# Patient Record
Sex: Female | Born: 1940 | Race: White | Hispanic: No | Smoking: Never smoker
Health system: Southern US, Community
[De-identification: ages and names within clinical notes are randomized; demographics above are authoritative.]

## PROBLEM LIST (undated history)

## (undated) DIAGNOSIS — I00 Rheumatic fever without heart involvement: Secondary | ICD-10-CM

## (undated) DIAGNOSIS — M1990 Unspecified osteoarthritis, unspecified site: Secondary | ICD-10-CM

## (undated) DIAGNOSIS — K219 Gastro-esophageal reflux disease without esophagitis: Secondary | ICD-10-CM

## (undated) DIAGNOSIS — E039 Hypothyroidism, unspecified: Secondary | ICD-10-CM

## (undated) DIAGNOSIS — Z8719 Personal history of other diseases of the digestive system: Secondary | ICD-10-CM

## (undated) DIAGNOSIS — C439 Malignant melanoma of skin, unspecified: Secondary | ICD-10-CM

## (undated) HISTORY — PX: TONSILLECTOMY: SUR1361

## (undated) HISTORY — PX: COLONOSCOPY: SHX174

## (undated) HISTORY — PX: ABDOMINAL HYSTERECTOMY: SHX81

## (undated) HISTORY — PX: APPENDECTOMY: SHX54

## (undated) HISTORY — PX: CHOLECYSTECTOMY: SHX55

---

## 2017-12-01 ENCOUNTER — Other Ambulatory Visit: Payer: Self-pay | Admitting: Orthopedic Surgery

## 2017-12-02 NOTE — Patient Instructions (Addendum)
Aneesa Romey  12/02/2017   Your procedure is scheduled on: Friday 12/10/2017   Report to [facility] Main  Entrance              Report to admitting at  1145  AM    Call this number if you have problems the morning of surgery [phone]    Remember: Do not eat food :After Midnight.  May have clear liquids from midnight up until 0815 am then nothing until after surgery!    CLEAR LIQUID DIET   Foods Allowed                                                                     Foods Excluded  Coffee and tea, regular and decaf                             liquids that you cannot  Plain Jell-O in any flavor                                             see through such as: Fruit ices (not with fruit pulp)                                     milk, soups, orange juice  Iced Popsicles                                    All solid food Carbonated beverages, regular and diet                                    Cranberry, grape and apple juices Sports drinks like Gatorade Lightly seasoned clear broth or consume(fat free) Sugar, honey syrup  Sample Menu Breakfast                                Lunch                                     Supper Cranberry juice                    Beef broth                            Chicken broth Jell-O                                     Grape juice  Apple juice Coffee or tea                        Jell-O                                      Popsicle                                                Coffee or tea                        Coffee or tea  _____________________________________________________________________               BRUSH YOUR TEETH MORNING OF SURGERY AND RINSE YOUR MOUTH OUT, NO CHEWING GUM CANDY OR MINTS.     Take these medicines the morning of surgery with A SIP OF WATER: levothyroxine, doxycycline               You may not have any metal on your body including hair pins and      piercings  Do not wear jewelry, make-up, lotions, powders or perfumes, deodorant             Do not wear nail polish.  Do not shave  48 hours prior to surgery.                Do not bring valuables to the hospital. [place].  Contacts, dentures or bridgework may not be worn into surgery.  Leave suitcase in the car. After surgery it may be brought to your room.                  Please read over the following fact sheets you were given: _____________________________________________________________________             [facility] - Preparing for Surgery Before surgery, you can play an important role.  Because skin is not sterile, your skin needs to be as free of germs as possible.  You can reduce the number of germs on your skin by washing with CHG (chlorahexidine gluconate) soap before surgery.  CHG is an antiseptic cleaner which kills germs and bonds with the skin to continue killing germs even after washing. Please DO NOT use if you have an allergy to CHG or antibacterial soaps.  If your skin becomes reddened/irritated stop using the CHG and inform your nurse when you arrive at Short Stay. Do not shave (including legs and underarms) for at least 48 hours prior to the first CHG shower.  You may shave your face/neck. Please follow these instructions carefully:  1.  Shower with CHG Soap the night before surgery and the  morning of Surgery.  2.  If you choose to wash your hair, wash your hair first as usual with your  normal  shampoo.  3.  After you shampoo, rinse your hair and body thoroughly to remove the  shampoo.                           4.  Use CHG as you would any other  liquid soap.  You can apply chg directly  to the skin and wash                       Gently with a scrungie or clean washcloth.  5.  Apply the CHG Soap to your body ONLY FROM THE NECK DOWN.   Do not use on face/ open                           Wound or open sores. Avoid  contact with eyes, ears mouth and genitals (private parts).                       Wash face,  Genitals (private parts) with your normal soap.             6.  Wash thoroughly, paying special attention to the area where your surgery  will be performed.  7.  Thoroughly rinse your body with warm water from the neck down.  8.  DO NOT shower/wash with your normal soap after using and rinsing off  the CHG Soap.                9.  Pat yourself dry with a clean towel.            10.  Wear clean pajamas.            11.  Place clean sheets on your bed the night of your first shower and do not  sleep with pets. Day of Surgery : Do not apply any lotions/deodorants the morning of surgery.  Please wear clean clothes to the hospital/surgery center.  FAILURE TO FOLLOW THESE INSTRUCTIONS MAY RESULT IN THE CANCELLATION OF YOUR SURGERY PATIENT SIGNATURE_________________________________  NURSE SIGNATURE__________________________________  ________________________________________________________________________   Adam Phenix  An incentive spirometer is a tool that can help keep your lungs clear and active. This tool measures how well you are filling your lungs with each breath. Taking long deep breaths may help reverse or decrease the chance of developing breathing (pulmonary) problems (especially infection) following:  A long period of time when you are unable to move or be active. BEFORE THE PROCEDURE   If the spirometer includes an indicator to show your best effort, your nurse or respiratory therapist will set it to a desired goal.  If possible, sit up straight or lean slightly forward. Try not to slouch.  Hold the incentive spirometer in an upright position. INSTRUCTIONS FOR USE  1. Sit on the edge of your bed if possible, or sit up as far as you can in bed or on a chair. 2. Hold the incentive spirometer in an upright position. 3. Breathe out normally. 4. Place the mouthpiece in your mouth  and seal your lips tightly around it. 5. Breathe in slowly and as deeply as possible, raising the piston or the ball toward the top of the column. 6. Hold your breath for 3-5 seconds or for as long as possible. Allow the piston or ball to fall to the bottom of the column. 7. Remove the mouthpiece from your mouth and breathe out normally. 8. Rest for a few seconds and repeat Steps 1 through 7 at least 10 times every 1-2 hours when you are awake. Take your time and take a few normal breaths between deep breaths. 9. The spirometer may include an indicator to show your best effort. Use the indicator as a  goal to work toward during each repetition. 10. After each set of 10 deep breaths, practice coughing to be sure your lungs are clear. If you have an incision (the cut made at the time of surgery), support your incision when coughing by placing a pillow or rolled up towels firmly against it. Once you are able to get out of bed, walk around indoors and cough well. You may stop using the incentive spirometer when instructed by your caregiver.  RISKS AND COMPLICATIONS  Take your time so you do not get dizzy or light-headed.  If you are in pain, you may need to take or ask for pain medication before doing incentive spirometry. It is harder to take a deep breath if you are having pain. AFTER USE  Rest and breathe slowly and easily.  It can be helpful to keep track of a log of your progress. Your caregiver can provide you with a simple table to help with this. If you are using the spirometer at home, follow these instructions: [place] IF:   You are having difficultly using the spirometer.  You have trouble using the spirometer as often as instructed.  Your pain medication is not giving enough relief while using the spirometer.  You develop fever of 100.5 F (38.1 C) or higher. SEEK IMMEDIATE MEDICAL CARE IF:   You cough up bloody sputum that had not been present before.  You develop  fever of 102 F (38.9 C) or greater.  You develop worsening pain at or near the incision site. MAKE SURE YOU:   Understand these instructions.  Will watch your condition.  Will get help right away if you are not doing well or get worse. Document Released: 05/18/2006 Document Revised: 03/30/2011 Document Reviewed: 07/19/2006 [facility] Patient Information 2014 [place], [place].   ________________________________________________________________________

## 2017-12-06 ENCOUNTER — Encounter (HOSPITAL_COMMUNITY)
Admission: RE | Admit: 2017-12-06 | Discharge: 2017-12-06 | Disposition: A | Discharge status: Home / Self Care | Payer: Medicare HMO | Source: Ambulatory Visit | Attending: Orthopedic Surgery | Admitting: Orthopedic Surgery

## 2017-12-06 ENCOUNTER — Other Ambulatory Visit: Payer: Self-pay

## 2017-12-06 ENCOUNTER — Encounter (HOSPITAL_COMMUNITY): Payer: Self-pay

## 2017-12-06 DIAGNOSIS — Z01812 Encounter for preprocedural laboratory examination: Secondary | ICD-10-CM | POA: Diagnosis present

## 2017-12-06 HISTORY — DX: Hypothyroidism, unspecified: E03.9

## 2017-12-06 HISTORY — DX: Rheumatic fever without heart involvement: I00

## 2017-12-06 LAB — CBC
HEMATOCRIT: 47.5 % — AB (ref 36.0–46.0)
Hemoglobin: 15.2 g/dL — ABNORMAL HIGH (ref 12.0–15.0)
MCH: 30.2 pg (ref 26.0–34.0)
MCHC: 32 g/dL (ref 30.0–36.0)
MCV: 94.4 fL (ref 80.0–100.0)
PLATELETS: 189 10*3/uL (ref 150–400)
RBC: 5.03 MIL/uL (ref 3.87–5.11)
RDW: 13.2 % (ref 11.5–15.5)
WBC: 4.7 10*3/uL (ref 4.0–10.5)
nRBC: 0 % (ref 0.0–0.2)

## 2017-12-06 LAB — BASIC METABOLIC PANEL
Anion gap: 9 (ref 5–15)
BUN: 20 mg/dL (ref 8–23)
CALCIUM: 9.6 mg/dL (ref 8.9–10.3)
CO2: 29 mmol/L (ref 22–32)
Chloride: 105 mmol/L (ref 98–111)
Creatinine, Ser: 0.78 mg/dL (ref 0.44–1.00)
GFR calc Af Amer: >60 mL/min (ref >60)
GLUCOSE: 101 mg/dL — AB (ref 70–99)
Potassium: 3.8 mmol/L (ref 3.5–5.1)
Sodium: 143 mmol/L (ref 135–145)

## 2017-12-06 LAB — SURGICAL PCR SCREEN
MRSA, PCR: NEGATIVE
Staphylococcus aureus: NEGATIVE

## 2017-12-06 NOTE — Progress Notes (Signed)
LVM with Lacretia Nicks . Informing her pt. Surgery is booked for a left hip and pt. Stated at preop that it is supposed to be her left knee she is having surgery on. No orders at preop . Labs done per anesthesia .

## 2017-12-07 ENCOUNTER — Other Ambulatory Visit: Payer: Self-pay | Admitting: Orthopedic Surgery

## 2017-12-07 NOTE — Progress Notes (Signed)
No orders at preop please place orders for surgery! Cbc and bmp done at preop per anesthesia! Thank You!

## 2017-12-09 ENCOUNTER — Encounter (HOSPITAL_COMMUNITY): Payer: Self-pay | Admitting: Orthopedic Surgery

## 2017-12-09 MED ORDER — BUPIVACAINE LIPOSOME 1.3 % IJ SUSP
20.0000 mL | INTRAMUSCULAR | Status: DC
  Filled 2017-12-09: qty 20

## 2017-12-09 NOTE — H&P (Signed)
TOTAL KNEE ADMISSION H&P  Patient is being admitted for left total knee arthroplasty.  Subjective:  Chief Complaint:left knee pain.  HPI: [name], 77 y.o. female, has a history of pain and functional disability in the left knee due to arthritis and has failed non-surgical conservative treatments for greater than 12 weeks to includeNSAID's and/or analgesics, use of assistive devices, weight reduction as appropriate and activity modification.  Onset of symptoms was gradual, starting 5 years ago with gradually worsening course since that time. The patient noted no past surgery on the left knee(s).  Patient currently rates pain in the left knee(s) at 8 out of 10 with activity. Patient has night pain, worsening of pain with activity and weight bearing, pain that interferes with activities of daily living, pain with passive range of motion and joint swelling.  Patient has evidence of subchondral cysts, periarticular osteophytes, joint subluxation and joint space narrowing by imaging studies. This patient has had failure of all reasonable conservative care. There is no active infection.  There are no active problems to display for this patient.  Past Medical History:  Diagnosis Date  . Hypothyroidism   . Rheumatic fever    as a kid      Current Facility-Administered Medications  Medication Dose Route Frequency Provider Last Rate Last Dose  . [START ON 12/10/2017] bupivacaine liposome (EXPAREL) 1.3 % injection 266 mg  20 mL Infiltration On Call to OR Dorna Leitz, MD       Current Outpatient Medications  Medication Sig Dispense Refill Last Dose  . CALCIUM ACETATE-MAGNESIUM CARB PO Take 1 tablet by mouth daily.   12/05/2017 at Unknown time  . co-enzyme Q-10 30 MG capsule Take 30 mg by mouth daily.   12/05/2017 at Unknown time  . doxycycline (VIBRAMYCIN) 50 MG capsule Take 50 mg by mouth daily.   12/05/2017 at Unknown time  . famotidine-calcium carbonate-magnesium hydroxide (PEPCID  COMPLETE) 10-800-165 MG chewable tablet Chew 3 tablets by mouth daily.   12/05/2017 at Unknown time  . Grape Seed Extract 50 MG CAPS Take 4 capsules by mouth daily.   12/05/2017 at Unknown time  . levothyroxine (SYNTHROID, LEVOTHROID) 75 MCG tablet Take 75 mcg by mouth daily before breakfast.    12/05/2017 at Unknown time  . Multiple Vitamin (MULTIVITAMIN WITH MINERALS) TABS tablet Take 1 tablet by mouth daily.   12/05/2017 at Unknown time  . Multiple Vitamins-Minerals (VISION FORMULA/LUTEIN) TABS Take 1 tablet by mouth daily.   12/05/2017 at Unknown time  . niacin 500 MG tablet Take 500 mg by mouth daily.   12/05/2017 at Unknown time  . Omega-3 Fatty Acids (FISH OIL) 1200 MG CPDR Take 1 capsule by mouth daily.   12/05/2017 at Unknown time  . Zinc 15 MG CAPS Take 1 capsule by mouth daily.   12/05/2017 at Unknown time   No Known Allergies  Social History   Tobacco Use  . Smoking status: Never Smoker  . Smokeless tobacco: Never Used  Substance Use Topics  . Alcohol use: Never    Frequency: Never    No family history on file.   ROS ROS: I have reviewed the patient's review of systems thoroughly and there are no positive responses as relates to the HPI. Objective:  Physical Exam  Vital signs in last 24 hours:   Well-developed well-nourished patient in no acute distress. Alert and oriented x3 HEENT:within normal limits Cardiac: Regular rate and rhythm Pulmonary: Lungs clear to auscultation Abdomen: Soft and nontender.  Normal active bowel  sounds  Musculoskeletal: (*Left knee: Painful range of motion.  Limited range of motion.  No instability.  Trace effusion.  Neurovascular intact distally. Labs: Recent Results (from the past 2160 hour(s))  Surgical pcr screen     Status: None   Collection Time: 12/06/17  8:25 AM  Result Value Ref Range   MRSA, PCR NEGATIVE NEGATIVE   Staphylococcus aureus NEGATIVE NEGATIVE    Comment: (NOTE) The Xpert SA Assay (FDA approved for NASAL  specimens in patients 73 years of age and older), is one component of a comprehensive surveillance program. It is not intended to diagnose infection nor to guide or monitor treatment. Performed at [facility], [place] [address], [place], [place] [address]   CBC     Status: Abnormal   Collection Time: 12/06/17  8:45 AM  Result Value Ref Range   WBC 4.7 4.0 - 10.5 K/uL   RBC 5.03 3.87 - 5.11 MIL/uL   Hemoglobin 15.2 (H) 12.0 - 15.0 g/dL   HCT 47.5 (H) 36.0 - 46.0 %   MCV 94.4 80.0 - 100.0 fL   MCH 30.2 26.0 - 34.0 pg   MCHC 32.0 30.0 - 36.0 g/dL   RDW 13.2 11.5 - 15.5 %   Platelets 189 150 - 400 K/uL   nRBC 0.0 0.0 - 0.2 %    Comment: Performed at [facility], [place] [address], [place], [place] [address]  Basic metabolic panel     Status: Abnormal   Collection Time: 12/06/17  8:45 AM  Result Value Ref Range   Sodium 143 135 - 145 mmol/L   Potassium 3.8 3.5 - 5.1 mmol/L   Chloride 105 98 - 111 mmol/L   CO2 29 22 - 32 mmol/L   Glucose, Bld 101 (H) 70 - 99 mg/dL   BUN 20 8 - 23 mg/dL   Creatinine, Ser 0.78 0.44 - 1.00 mg/dL   Calcium 9.6 8.9 - 10.3 mg/dL   GFR calc non Af Amer >60 >60 mL/min   GFR calc Af Amer >60 >60 mL/min    Comment: (NOTE) The eGFR has been calculated using the CKD EPI equation. This calculation has not been validated in all clinical situations. eGFR's persistently <60 mL/min signify possible Chronic Kidney Disease.    Anion gap 9 5 - 15    Comment: Performed at [facility], [place] [address], [place], [place] [address]   Estimated body mass index is 26.39 kg/m as calculated from the following:   Height as of 12/06/17: 5' 7.5" (1.715 m).   Weight as of 12/06/17: 77.6 kg.   Imaging Review Plain radiographs demonstrate severe degenerative joint disease of the left knee(s). The overall alignment ismild varus. The bone quality appears to be fair for age and reported activity level.   Preoperative  templating of the joint replacement has been completed, documented, and submitted to the Operating Room personnel in order to optimize intra-operative equipment management.   Anticipated LOS equal to or greater than 2 midnights due to - Age 15 and older with one or more of the following:  - Obesity  - Expected need for hospital services (PT, OT, Nursing) required for safe  discharge  - Anticipated need for postoperative skilled nursing care or inpatient rehab  - Active co-morbidities: significant chronic medical problems of rheumatic fever and hypothyroidism OR   - Unanticipated findings during/Post Surgery: ssignificant chronic medical problems  - Patient is a high risk of re-admission due to: Barriers to post-acute  care (logistical, no family support in home)     Assessment/Plan:  End stage arthritis, left knee   The patient history, physical examination, clinical judgment of the provider and imaging studies are consistent with end stage degenerative joint disease of the left knee(s) and total knee arthroplasty is deemed medically necessary. The treatment options including medical management, injection therapy arthroscopy and arthroplasty were discussed at length. The risks and benefits of total knee arthroplasty were presented and reviewed. The risks due to aseptic loosening, infection, stiffness, patella tracking problems, thromboembolic complications and other imponderables were discussed. The patient acknowledged the explanation, agreed to proceed with the plan and consent was signed. Patient is being admitted for inpatient treatment for surgery, pain control, PT, OT, prophylactic antibiotics, VTE prophylaxis, progressive ambulation and ADL's and discharge planning. The patient is planning to be discharged home with home health services

## 2017-12-10 ENCOUNTER — Ambulatory Visit (HOSPITAL_COMMUNITY): Payer: Medicare HMO

## 2017-12-10 ENCOUNTER — Encounter (HOSPITAL_COMMUNITY)
Admission: RE | Disposition: A | Discharge status: Home Health | Payer: Self-pay | Source: Home / Self Care | Attending: Orthopedic Surgery

## 2017-12-10 ENCOUNTER — Ambulatory Visit (HOSPITAL_COMMUNITY): Payer: Medicare HMO | Admitting: Anesthesiology

## 2017-12-10 ENCOUNTER — Other Ambulatory Visit: Payer: Self-pay

## 2017-12-10 ENCOUNTER — Encounter (HOSPITAL_COMMUNITY): Payer: Self-pay

## 2017-12-10 ENCOUNTER — Inpatient Hospital Stay (HOSPITAL_COMMUNITY)
Admission: RE | Admit: 2017-12-10 | Discharge: 2017-12-12 | DRG: 470 | Disposition: A | Discharge status: Home Health | Payer: Medicare HMO | Attending: Orthopedic Surgery | Admitting: Orthopedic Surgery

## 2017-12-10 DIAGNOSIS — M1712 Unilateral primary osteoarthritis, left knee: Secondary | ICD-10-CM | POA: Diagnosis present

## 2017-12-10 DIAGNOSIS — E039 Hypothyroidism, unspecified: Secondary | ICD-10-CM | POA: Diagnosis present

## 2017-12-10 DIAGNOSIS — Z79890 Hormone replacement therapy (postmenopausal): Secondary | ICD-10-CM

## 2017-12-10 DIAGNOSIS — Z01811 Encounter for preprocedural respiratory examination: Secondary | ICD-10-CM

## 2017-12-10 DIAGNOSIS — M25562 Pain in left knee: Secondary | ICD-10-CM | POA: Diagnosis present

## 2017-12-10 DIAGNOSIS — Z79899 Other long term (current) drug therapy: Secondary | ICD-10-CM

## 2017-12-10 HISTORY — PX: TOTAL KNEE ARTHROPLASTY: SHX125

## 2017-12-10 LAB — COMPREHENSIVE METABOLIC PANEL
ALBUMIN: 4.4 g/dL (ref 3.5–5.0)
ALK PHOS: 44 U/L (ref 38–126)
ALT: 23 U/L (ref 0–44)
ANION GAP: 7 (ref 5–15)
AST: 23 U/L (ref 15–41)
BILIRUBIN TOTAL: 1.1 mg/dL (ref 0.3–1.2)
BUN: 20 mg/dL (ref 8–23)
CALCIUM: 9.6 mg/dL (ref 8.9–10.3)
CO2: 31 mmol/L (ref 22–32)
Chloride: 106 mmol/L (ref 98–111)
Creatinine, Ser: 0.83 mg/dL (ref 0.44–1.00)
GFR calc Af Amer: >60 mL/min (ref >60)
GFR calc non Af Amer: >60 mL/min (ref >60)
GLUCOSE: 93 mg/dL (ref 70–99)
Potassium: 4.1 mmol/L (ref 3.5–5.1)
SODIUM: 144 mmol/L (ref 135–145)
TOTAL PROTEIN: 6.3 g/dL — AB (ref 6.5–8.1)

## 2017-12-10 LAB — TYPE AND SCREEN
ABO/RH(D): A POS
Antibody Screen: NEGATIVE

## 2017-12-10 LAB — URINALYSIS, ROUTINE W REFLEX MICROSCOPIC
BILIRUBIN URINE: NEGATIVE
Glucose, UA: NEGATIVE mg/dL
HGB URINE DIPSTICK: NEGATIVE
Ketones, ur: NEGATIVE mg/dL
NITRITE: NEGATIVE
PROTEIN: NEGATIVE mg/dL
SPECIFIC GRAVITY, URINE: 1.013 (ref 1.005–1.030)
pH: 8 (ref 5.0–8.0)

## 2017-12-10 LAB — CBC WITH DIFFERENTIAL/PLATELET
ABS IMMATURE GRANULOCYTES: 0.01 10*3/uL (ref 0.00–0.07)
Basophils Absolute: 0 10*3/uL (ref 0.0–0.1)
Basophils Relative: 0 %
Eosinophils Absolute: 0.1 10*3/uL (ref 0.0–0.5)
Eosinophils Relative: 2 %
HCT: 48.5 % — ABNORMAL HIGH (ref 36.0–46.0)
Hemoglobin: 15.5 g/dL — ABNORMAL HIGH (ref 12.0–15.0)
IMMATURE GRANULOCYTES: 0 %
Lymphocytes Relative: 31 %
Lymphs Abs: 1.2 10*3/uL (ref 0.7–4.0)
MCH: 30.2 pg (ref 26.0–34.0)
MCHC: 32 g/dL (ref 30.0–36.0)
MCV: 94.4 fL (ref 80.0–100.0)
Monocytes Absolute: 0.5 10*3/uL (ref 0.1–1.0)
Monocytes Relative: 13 %
NEUTROS ABS: 2.1 10*3/uL (ref 1.7–7.7)
NEUTROS PCT: 54 %
Platelets: 183 10*3/uL (ref 150–400)
RBC: 5.14 MIL/uL — AB (ref 3.87–5.11)
RDW: 13.2 % (ref 11.5–15.5)
WBC: 3.9 10*3/uL — AB (ref 4.0–10.5)
nRBC: 0 % (ref 0.0–0.2)

## 2017-12-10 LAB — ABO/RH: ABO/RH(D): A POS

## 2017-12-10 LAB — APTT: APTT: 26 s (ref 24–36)

## 2017-12-10 LAB — PROTIME-INR
INR: 0.91
Prothrombin Time: 12.2 seconds (ref 11.4–15.2)

## 2017-12-10 SURGERY — ARTHROPLASTY, KNEE, TOTAL
Anesthesia: Spinal | Site: Knee | Laterality: Left

## 2017-12-10 MED ORDER — ONDANSETRON HCL 4 MG PO TABS: 4.0000 mg | ORAL_TABLET | Freq: Four times a day (QID) | ORAL | Status: DC | PRN

## 2017-12-10 MED ORDER — PANTOPRAZOLE SODIUM 40 MG PO TBEC
40.0000 mg | DELAYED_RELEASE_TABLET | Freq: Every day | ORAL | Status: DC
  Administered 2017-12-10 – 2017-12-12 (×3): 40 mg via ORAL
  Filled 2017-12-10 (×3): qty 1

## 2017-12-10 MED ORDER — SODIUM CHLORIDE 0.9 % IV SOLN
INTRAVENOUS | Status: DC | PRN
  Administered 2017-12-10: 40 ug/min via INTRAVENOUS

## 2017-12-10 MED ORDER — OXYCODONE HCL 5 MG PO TABS: 5.0000 mg | ORAL_TABLET | Freq: Once | ORAL | Status: DC | PRN

## 2017-12-10 MED ORDER — PROPOFOL 10 MG/ML IV BOLUS
INTRAVENOUS | Status: DC | PRN
  Administered 2017-12-10: 30 mg via INTRAVENOUS
  Administered 2017-12-10: 20 mg via INTRAVENOUS

## 2017-12-10 MED ORDER — CEFAZOLIN SODIUM-DEXTROSE 2-4 GM/100ML-% IV SOLN
2.0000 g | Freq: Four times a day (QID) | INTRAVENOUS | Status: AC
  Administered 2017-12-10 (×2): 2 g via INTRAVENOUS
  Filled 2017-12-10 (×2): qty 100

## 2017-12-10 MED ORDER — POVIDONE-IODINE 10 % EX SWAB: 2.0000 "application " | Freq: Once | CUTANEOUS | Status: DC

## 2017-12-10 MED ORDER — TRANEXAMIC ACID-NACL 1000-0.7 MG/100ML-% IV SOLN
1000.0000 mg | INTRAVENOUS | Status: AC
  Administered 2017-12-10: 1000 mg via INTRAVENOUS
  Filled 2017-12-10: qty 100

## 2017-12-10 MED ORDER — ONDANSETRON HCL 4 MG/2ML IJ SOLN
INTRAMUSCULAR | Status: AC
  Filled 2017-12-10: qty 2

## 2017-12-10 MED ORDER — HYDROCODONE-ACETAMINOPHEN 5-325 MG PO TABS
1.0000 | ORAL_TABLET | ORAL | Status: DC | PRN
  Administered 2017-12-10: 2 via ORAL
  Administered 2017-12-10: 1 via ORAL
  Administered 2017-12-11 (×2): 2 via ORAL
  Filled 2017-12-10: qty 2
  Filled 2017-12-10: qty 1
  Filled 2017-12-10: qty 2
  Filled 2017-12-10 (×2): qty 1

## 2017-12-10 MED ORDER — HYDROMORPHONE HCL 1 MG/ML IJ SOLN
INTRAMUSCULAR | Status: AC
  Administered 2017-12-10: 0.5 mg via INTRAVENOUS
  Filled 2017-12-10: qty 1

## 2017-12-10 MED ORDER — TIZANIDINE HCL 2 MG PO TABS: 2.0000 mg | ORAL_TABLET | Freq: Three times a day (TID) | ORAL | 0 refills | Status: DC | PRN

## 2017-12-10 MED ORDER — GABAPENTIN 100 MG PO CAPS
100.0000 mg | ORAL_CAPSULE | Freq: Two times a day (BID) | ORAL | Status: DC
  Administered 2017-12-10 – 2017-12-12 (×4): 100 mg via ORAL
  Filled 2017-12-10 (×4): qty 1

## 2017-12-10 MED ORDER — DIPHENHYDRAMINE HCL 12.5 MG/5ML PO ELIX: 12.5000 mg | ORAL_SOLUTION | ORAL | Status: DC | PRN

## 2017-12-10 MED ORDER — MAGNESIUM CITRATE PO SOLN: 1.0000 | Freq: Once | ORAL | Status: DC | PRN

## 2017-12-10 MED ORDER — SODIUM CHLORIDE (PF) 0.9 % IJ SOLN
INTRAMUSCULAR | Status: AC
  Filled 2017-12-10: qty 50

## 2017-12-10 MED ORDER — HYDROCODONE-ACETAMINOPHEN 5-325 MG PO TABS: 1.0000 | ORAL_TABLET | Freq: Four times a day (QID) | ORAL | 0 refills | Status: DC | PRN

## 2017-12-10 MED ORDER — SODIUM CHLORIDE 0.9 % IV SOLN
INTRAVENOUS | Status: DC
  Administered 2017-12-10 – 2017-12-11 (×2): via INTRAVENOUS

## 2017-12-10 MED ORDER — ACETAMINOPHEN 325 MG PO TABS
325.0000 mg | ORAL_TABLET | Freq: Four times a day (QID) | ORAL | Status: DC | PRN
  Administered 2017-12-11 – 2017-12-12 (×3): 650 mg via ORAL
  Filled 2017-12-10 (×3): qty 2

## 2017-12-10 MED ORDER — CHLORHEXIDINE GLUCONATE 4 % EX LIQD: 60.0000 mL | Freq: Once | CUTANEOUS | Status: DC

## 2017-12-10 MED ORDER — METHOCARBAMOL 500 MG PO TABS
500.0000 mg | ORAL_TABLET | Freq: Four times a day (QID) | ORAL | Status: DC | PRN
  Administered 2017-12-10 – 2017-12-12 (×6): 500 mg via ORAL
  Filled 2017-12-10 (×5): qty 1

## 2017-12-10 MED ORDER — FENTANYL CITRATE (PF) 100 MCG/2ML IJ SOLN
50.0000 ug | INTRAMUSCULAR | Status: DC
  Administered 2017-12-10: 50 ug via INTRAVENOUS
  Filled 2017-12-10: qty 2

## 2017-12-10 MED ORDER — ONDANSETRON HCL 4 MG/2ML IJ SOLN: 4.0000 mg | Freq: Once | INTRAMUSCULAR | Status: DC | PRN

## 2017-12-10 MED ORDER — PROPOFOL 10 MG/ML IV BOLUS
INTRAVENOUS | Status: AC
  Filled 2017-12-10: qty 60

## 2017-12-10 MED ORDER — CELECOXIB 200 MG PO CAPS
200.0000 mg | ORAL_CAPSULE | Freq: Two times a day (BID) | ORAL | Status: DC
  Administered 2017-12-10 – 2017-12-12 (×4): 200 mg via ORAL
  Filled 2017-12-10 (×4): qty 1

## 2017-12-10 MED ORDER — ASPIRIN EC 325 MG PO TBEC
325.0000 mg | DELAYED_RELEASE_TABLET | Freq: Two times a day (BID) | ORAL | Status: DC
  Administered 2017-12-10 – 2017-12-12 (×4): 325 mg via ORAL
  Filled 2017-12-10 (×4): qty 1

## 2017-12-10 MED ORDER — ONDANSETRON HCL 4 MG/2ML IJ SOLN
4.0000 mg | Freq: Four times a day (QID) | INTRAMUSCULAR | Status: DC | PRN
  Administered 2017-12-10: 4 mg via INTRAVENOUS
  Filled 2017-12-10: qty 2

## 2017-12-10 MED ORDER — BUPIVACAINE-EPINEPHRINE (PF) 0.25% -1:200000 IJ SOLN
INTRAMUSCULAR | Status: AC
  Filled 2017-12-10: qty 30

## 2017-12-10 MED ORDER — 0.9 % SODIUM CHLORIDE (POUR BTL) OPTIME
TOPICAL | Status: DC | PRN
  Administered 2017-12-10: 1000 mL

## 2017-12-10 MED ORDER — MIDAZOLAM HCL 2 MG/2ML IJ SOLN
1.0000 mg | INTRAMUSCULAR | Status: DC
  Administered 2017-12-10: 0.5 mg via INTRAVENOUS
  Filled 2017-12-10: qty 2

## 2017-12-10 MED ORDER — LACTATED RINGERS IV SOLN
INTRAVENOUS | Status: DC
  Administered 2017-12-10 (×2): via INTRAVENOUS

## 2017-12-10 MED ORDER — DOCUSATE SODIUM 100 MG PO CAPS: 100.0000 mg | ORAL_CAPSULE | Freq: Two times a day (BID) | ORAL | 0 refills | Status: DC

## 2017-12-10 MED ORDER — ASPIRIN EC 325 MG PO TBEC: 325.0000 mg | DELAYED_RELEASE_TABLET | Freq: Two times a day (BID) | ORAL | 0 refills | Status: DC

## 2017-12-10 MED ORDER — DEXAMETHASONE SODIUM PHOSPHATE 10 MG/ML IJ SOLN
INTRAMUSCULAR | Status: DC | PRN
  Administered 2017-12-10: 10 mg via INTRAVENOUS

## 2017-12-10 MED ORDER — CEFAZOLIN SODIUM-DEXTROSE 2-4 GM/100ML-% IV SOLN
2.0000 g | INTRAVENOUS | Status: AC
  Administered 2017-12-10: 2 g via INTRAVENOUS
  Filled 2017-12-10: qty 100

## 2017-12-10 MED ORDER — DEXAMETHASONE SODIUM PHOSPHATE 10 MG/ML IJ SOLN
10.0000 mg | Freq: Two times a day (BID) | INTRAMUSCULAR | Status: AC
  Administered 2017-12-10 – 2017-12-11 (×3): 10 mg via INTRAVENOUS
  Filled 2017-12-10 (×3): qty 1

## 2017-12-10 MED ORDER — TRANEXAMIC ACID-NACL 1000-0.7 MG/100ML-% IV SOLN
1000.0000 mg | Freq: Once | INTRAVENOUS | Status: AC
  Administered 2017-12-10: 1000 mg via INTRAVENOUS
  Filled 2017-12-10: qty 100

## 2017-12-10 MED ORDER — POLYETHYLENE GLYCOL 3350 17 G PO PACK
17.0000 g | PACK | Freq: Every day | ORAL | Status: DC | PRN
  Administered 2017-12-12: 17 g via ORAL
  Filled 2017-12-10: qty 1

## 2017-12-10 MED ORDER — WATER FOR IRRIGATION, STERILE IR SOLN
Status: DC | PRN
  Administered 2017-12-10: 2000 mL

## 2017-12-10 MED ORDER — FENTANYL CITRATE (PF) 100 MCG/2ML IJ SOLN: 25.0000 ug | INTRAMUSCULAR | Status: DC | PRN

## 2017-12-10 MED ORDER — LEVOTHYROXINE SODIUM 75 MCG PO TABS
75.0000 ug | ORAL_TABLET | Freq: Every day | ORAL | Status: DC
  Administered 2017-12-11 – 2017-12-12 (×2): 75 ug via ORAL
  Filled 2017-12-10 (×2): qty 1

## 2017-12-10 MED ORDER — BUPIVACAINE-EPINEPHRINE 0.25% -1:200000 IJ SOLN
INTRAMUSCULAR | Status: DC | PRN
  Administered 2017-12-10: 20 mL

## 2017-12-10 MED ORDER — ALUM & MAG HYDROXIDE-SIMETH 200-200-20 MG/5ML PO SUSP: 30.0000 mL | ORAL | Status: DC | PRN

## 2017-12-10 MED ORDER — OXYCODONE HCL 5 MG/5ML PO SOLN: 5.0000 mg | Freq: Once | ORAL | Status: DC | PRN

## 2017-12-10 MED ORDER — HYDROMORPHONE HCL 1 MG/ML IJ SOLN
0.5000 mg | INTRAMUSCULAR | Status: DC | PRN
  Administered 2017-12-10 (×2): 0.5 mg via INTRAVENOUS

## 2017-12-10 MED ORDER — BUPIVACAINE LIPOSOME 1.3 % IJ SUSP
10.0000 mL | Freq: Once | INTRAMUSCULAR | Status: AC
  Administered 2017-12-10: 10 mL
  Filled 2017-12-10: qty 10

## 2017-12-10 MED ORDER — METHOCARBAMOL 500 MG IVPB - SIMPLE MED
500.0000 mg | Freq: Four times a day (QID) | INTRAVENOUS | Status: DC | PRN
  Filled 2017-12-10: qty 50

## 2017-12-10 MED ORDER — DEXAMETHASONE SODIUM PHOSPHATE 10 MG/ML IJ SOLN
INTRAMUSCULAR | Status: AC
  Filled 2017-12-10: qty 1

## 2017-12-10 MED ORDER — PHENYLEPHRINE HCL 10 MG/ML IJ SOLN
INTRAMUSCULAR | Status: AC
  Filled 2017-12-10: qty 1

## 2017-12-10 MED ORDER — PROPOFOL 500 MG/50ML IV EMUL
INTRAVENOUS | Status: DC | PRN
  Administered 2017-12-10: 100 ug/kg/min via INTRAVENOUS

## 2017-12-10 MED ORDER — SODIUM CHLORIDE 0.9% FLUSH
INTRAVENOUS | Status: DC | PRN
  Administered 2017-12-10: 50 mL

## 2017-12-10 MED ORDER — BISACODYL 5 MG PO TBEC: 5.0000 mg | DELAYED_RELEASE_TABLET | Freq: Every day | ORAL | Status: DC | PRN

## 2017-12-10 MED ORDER — SODIUM CHLORIDE 0.9 % IR SOLN
Status: DC | PRN
  Administered 2017-12-10: 1000 mL

## 2017-12-10 MED ORDER — DOCUSATE SODIUM 100 MG PO CAPS
100.0000 mg | ORAL_CAPSULE | Freq: Two times a day (BID) | ORAL | Status: DC
  Administered 2017-12-10 – 2017-12-12 (×4): 100 mg via ORAL
  Filled 2017-12-10 (×4): qty 1

## 2017-12-10 MED ORDER — METHOCARBAMOL 500 MG IVPB - SIMPLE MED
INTRAVENOUS | Status: AC
  Filled 2017-12-10: qty 50

## 2017-12-10 SURGICAL SUPPLY — 53 items
ATTUNE MED DOME PAT 38 KNEE (Knees) ×2 IMPLANT
ATTUNE PS FEM LT SZ 6 CEM KNEE (Femur) ×2 IMPLANT
ATTUNE PSRP INSR SZ6 5 KNEE (Insert) ×2 IMPLANT
BAG ZIPLOCK 12X15 (MISCELLANEOUS) ×2 IMPLANT
BANDAGE ACE 6X5 VEL STRL LF (GAUZE/BANDAGES/DRESSINGS) ×2 IMPLANT
BASE TIBIA ATTUNE KNEE SYS SZ6 (Knees) ×1 IMPLANT
BENZOIN TINCTURE PRP APPL 2/3 (GAUZE/BANDAGES/DRESSINGS) ×2 IMPLANT
BLADE SAGITTAL 25.0X1.19X90 (BLADE) ×2 IMPLANT
BLADE SAW SGTL 11.0X1.19X90.0M (BLADE) ×2 IMPLANT
BNDG ELASTIC 6X10 VLCR STRL LF (GAUZE/BANDAGES/DRESSINGS) ×2 IMPLANT
BOOTIES KNEE HIGH SLOAN (MISCELLANEOUS) ×2 IMPLANT
BOWL SMART MIX CTS (DISPOSABLE) ×2 IMPLANT
CEMENT HV SMART SET (Cement) ×4 IMPLANT
COVER WAND RF STERILE (DRAPES) IMPLANT
CUFF TOURN SGL QUICK 34 (TOURNIQUET CUFF) ×1
CUFF TRNQT CYL 34X4X40X1 (TOURNIQUET CUFF) ×1 IMPLANT
DECANTER SPIKE VIAL GLASS SM (MISCELLANEOUS) IMPLANT
DRAPE U-SHAPE 47X51 STRL (DRAPES) ×2 IMPLANT
DRSG AQUACEL AG ADV 3.5X10 (GAUZE/BANDAGES/DRESSINGS) ×2 IMPLANT
DURAPREP 26ML APPLICATOR (WOUND CARE) ×2 IMPLANT
ELECT REM PT RETURN 15FT ADLT (MISCELLANEOUS) ×2 IMPLANT
GLOVE BIOGEL PI IND STRL 8 (GLOVE) ×2 IMPLANT
GLOVE BIOGEL PI INDICATOR 8 (GLOVE) ×2
GLOVE ECLIPSE 7.5 STRL STRAW (GLOVE) ×4 IMPLANT
GOWN STRL REUS W/TWL XL LVL3 (GOWN DISPOSABLE) ×4 IMPLANT
HANDPIECE INTERPULSE COAX TIP (DISPOSABLE) ×1
HOLDER FOLEY CATH W/STRAP (MISCELLANEOUS) IMPLANT
HOOD PEEL AWAY FLYTE STAYCOOL (MISCELLANEOUS) ×6 IMPLANT
IMMOBILIZER KNEE 20 (SOFTGOODS) ×4 IMPLANT
IMMOBILIZER KNEE 20 THIGH 36 (SOFTGOODS) ×1 IMPLANT
MANIFOLD NEPTUNE II (INSTRUMENTS) ×2 IMPLANT
NEEDLE HYPO 22GX1.5 SAFETY (NEEDLE) ×2 IMPLANT
NS IRRIG 1000ML POUR BTL (IV SOLUTION) ×2 IMPLANT
PACK ICE MAXI GEL EZY WRAP (MISCELLANEOUS) ×2 IMPLANT
PACK TOTAL KNEE CUSTOM (KITS) ×2 IMPLANT
PADDING CAST COTTON 6X4 STRL (CAST SUPPLIES) ×2 IMPLANT
PIN STEINMAN FIXATION KNEE (PIN) ×2 IMPLANT
PIN THREADED HEADED SIGMA (PIN) ×2 IMPLANT
PROTECTOR NERVE ULNAR (MISCELLANEOUS) ×2 IMPLANT
SET HNDPC FAN SPRY TIP SCT (DISPOSABLE) ×1 IMPLANT
STAPLER VISISTAT 35W (STAPLE) IMPLANT
STRIP CLOSURE SKIN 1/2X4 (GAUZE/BANDAGES/DRESSINGS) ×2 IMPLANT
SUT MNCRL AB 3-0 PS2 18 (SUTURE) ×2 IMPLANT
SUT VIC AB 0 CT1 36 (SUTURE) ×2 IMPLANT
SUT VIC AB 1 CT1 36 (SUTURE) ×4 IMPLANT
SUT VIC AB 2-0 CT1 27 (SUTURE) ×2
SUT VIC AB 2-0 CT1 TAPERPNT 27 (SUTURE) ×2 IMPLANT
SYR CONTROL 10ML LL (SYRINGE) ×4 IMPLANT
TIBIA ATTUNE KNEE SYS BASE SZ6 (Knees) ×2 IMPLANT
TRAY FOLEY MTR SLVR 16FR STAT (SET/KITS/TRAYS/PACK) ×2 IMPLANT
WATER STERILE IRR 1000ML POUR (IV SOLUTION) ×4 IMPLANT
WRAP KNEE MAXI GEL POST OP (GAUZE/BANDAGES/DRESSINGS) ×2 IMPLANT
YANKAUER SUCT BULB TIP 10FT TU (MISCELLANEOUS) ×2 IMPLANT

## 2017-12-10 NOTE — Anesthesia Preprocedure Evaluation (Signed)
Anesthesia Evaluation  Patient identified by MRN, date of birth, ID band Patient awake    Reviewed: Allergy & Precautions, NPO status , Patient's Chart, lab work & pertinent test results  Airway Mallampati: II  TM Distance: >3 FB Neck ROM: Full    Dental  (+) Edentulous Upper, Edentulous Lower   Pulmonary    breath sounds clear to auscultation       Cardiovascular  Rhythm:Regular Rate:Normal     Neuro/Psych    GI/Hepatic   Endo/Other    Renal/GU      Musculoskeletal   Abdominal   Peds  Hematology   Anesthesia Other Findings   Reproductive/Obstetrics                             Anesthesia Physical Anesthesia Plan  ASA: III  Anesthesia Plan: Spinal   Post-op Pain Management:  Regional for Post-op pain   Induction:   PONV Risk Score and Plan: Ondansetron and Dexamethasone  Airway Management Planned: Natural Airway and Simple Face Mask  Additional Equipment:   Intra-op Plan:   Post-operative Plan:   Informed Consent: I have reviewed the patients History and Physical, chart, labs and discussed the procedure including the risks, benefits and alternatives for the proposed anesthesia with the patient or authorized representative who has indicated his/her understanding and acceptance.     Plan Discussed with: CRNA and Anesthesiologist  Anesthesia Plan Comments:         Anesthesia Quick Evaluation

## 2017-12-10 NOTE — Progress Notes (Signed)
AssistedDr. Joslin with left, ultrasound guided, adductor canal block. Side rails up, monitors on throughout procedure. See vital signs in flow sheet. Tolerated Procedure well.  

## 2017-12-10 NOTE — Op Note (Signed)
NAMEJEMIMA, [name] MEDICAL RECORD [id number] ACCOUNT [id number] DATE OF BIRTH:03/10/1940 FACILITY: [facility] LOCATION: [id number] PHYSICIAN:Kynan Peasley L. Bedelia Pong, MD  OPERATIVE REPORT  DATE OF PROCEDURE:  12/10/2017  PREOPERATIVE DIAGNOSIS:  End-stage degenerative joint disease, left knee.  POSTOPERATIVE DIAGNOSIS:  End-stage degenerative joint disease, left knee.  PROCEDURE:  Left total knee replacement with an Attune system, size 6 femur, size 6 tibia, 5 mm bridging bearing, and a 38 mm all polyethylene patella.  SURGEON:  Dorna Leitz, MD  ASSISTANT:  Gaspar Skeeters, PA-C, who was present for the entire case and assisted with retraction, bone cuts, and closing to minimize or time.  BRIEF HISTORY:  The patient is a 77 year old female with a long history significant complaints of left knee pain.  She has been treated conservatively for a prolonged period of time.  X-ray showed bone-on-bone change.  She is having night pain and light  activity pain and failed all conservative care.  After failing this, she was taken to the operating room for left total knee replacement.  DESCRIPTION OF PROCEDURE:  The patient was taken to the operating room after adequate anesthesia was obtained with a spinal anesthetic, the patient was placed supine on the operating table.  Left leg was prepped and draped in the usual sterile fashion.   Following this, the leg was exsanguinated, blood pressure tourniquet inflated to 300 mmHg.  Following this, an incision was made, subcutaneous tissue down to the extensor mechanism.  Medial parapatellar arthrotomy was undertaken.  The medial and lateral  meniscus was removed, retropatellar fat pad synovium on the anterior aspect of the femur and anterior and posterior cruciates.  Intramedullary pilot hole was drilled followed by a 4 degree distal valgus inclination cut and 9 mm of distal bone.  Following  this, attention was turned towards sizing the femur, it sized to a 6.  Anterior  and posterior cuts were made chamfers and box.  Attention turned to the tibia.  It was cut perpendicular to its long axis with 3 degree posterior slope and the tibia was  then sized to a 6.  It was drilled and keeled.  The trial components were then put in place.  The patella was cut down to the level of 14 mm and a 38 paddle was chosen and lugs were drilled for the patella.  The patella was then put through a range of  motion.  Excellent stability and range of motion were achieved.  Attention was then turned towards the removal of trial components.  The knee was then copiously and thoroughly lavaged.  Pulsatile lavage irrigation and suctioned dry.  The final components  were then cemented into place, size 6 tibia, size 6 femur, 5 mm bridging bearing was placed as a trial and a 38 all poly patella was placed and held with a clamp.  All excess bone cement was removed.  The tourniquet was let down, all bleeders controlled  with electrocautery after the cement had completely hardened and attention was turned towards removal of any excess cement.  At that time, the final 5 was opened and placed, the knee put through a range of motion.  Excellent stability and range of  motion were achieved.  The medial parapatellar arthrotomy was closed with #1 Vicryl running, skin with 0 and 2-0 Vicryl and 3-0 Monocryl subcuticular.  Benzoin and Steri-Strips were applied.  Sterile compressive dressing was applied.  The patient was  taken to recovery was noted to be in satisfactory condition.  Estimated blood loss for  procedure was minimal.  TN/NUANCE  D:12/10/2017 T:12/10/2017 JOB:[id number]

## 2017-12-10 NOTE — Evaluation (Signed)
Physical Therapy Evaluation Patient Details Name: [name] MRN: [id number] DOB: 25-Apr-1940 Today's Date: 12/10/2017   History of Present Illness  Pt s/p L TKR   Clinical Impression  Pt s/p L TKR and presents with decreased L LE strength/ROM and post op pain limiting functional mobility.  Pt should progress to dc home with family assist.    Follow Up Recommendations Home health PT;Follow surgeon's recommendation for DC plan and follow-up therapies    Equipment Recommendations  None recommended by PT    Recommendations for Other Services       Precautions / Restrictions Precautions Precautions: Knee;Fall Required Braces or Orthoses: Knee Immobilizer - Left Knee Immobilizer - Left: Discontinue once straight leg raise with < 10 degree lag Restrictions Weight Bearing Restrictions: No Other Position/Activity Restrictions: WBAT      Mobility  Bed Mobility Overal bed mobility: Needs Assistance Bed Mobility: Supine to Sit;Sit to Supine     Supine to sit: Min assist;Mod assist Sit to supine: Min assist;Mod assist;+2 for physical assistance;+2 for safety/equipment   General bed mobility comments: cues for sequence and use of L LE to self assist  Transfers Overall transfer level: Needs assistance Equipment used: Rolling walker (2 wheeled) Transfers: Sit to/from Stand Sit to Stand: Min assist;+2 physical assistance;+2 safety/equipment;From elevated surface         General transfer comment: cues for LE management and use of UEs to self assist  Ambulation/Gait Ambulation/Gait assistance: Min assist;+2 physical assistance;+2 safety/equipment Gait Distance (Feet): 2 Feet Assistive device: Rolling walker (2 wheeled) Gait Pattern/deviations: Step-to pattern;Decreased step length - right;Decreased step length - left;Shuffle;Trunk flexed Gait velocity: decr   General Gait Details: Pt side stepped up side of bed only - ltd by nausea and dizziness  Stairs             Wheelchair Mobility    Modified Rankin (Stroke Patients Only)       Balance Overall balance assessment: Needs assistance Sitting-balance support: No upper extremity supported;Feet supported Sitting balance-Leahy Scale: Good     Standing balance support: Bilateral upper extremity supported Standing balance-Leahy Scale: Poor                               Pertinent Vitals/Pain Pain Assessment: 0-10 Pain Score: 6  Pain Location: L knee Pain Descriptors / Indicators: Aching;Sore;Spasm Pain Intervention(s): Limited activity within patient's tolerance;Monitored during session;Premedicated before session;Patient requesting pain meds-RN notified;Ice applied    Home Living Family/patient expects to be discharged to:: Private residence Living Arrangements: Spouse/significant other Available Help at Discharge: Family Type of Home: House Home Access: Stairs to enter Entrance Stairs-Rails: Left Entrance Stairs-Number of Steps: 2 with no rail vs 4 with single rail Home Layout: Able to live on main level with bedroom/bathroom Home Equipment: Walker - 2 wheels;Cane - single point;Bedside commode      Prior Function Level of Independence: Independent               Hand Dominance        Extremity/Trunk Assessment   Upper Extremity Assessment Upper Extremity Assessment: Overall WFL for tasks assessed    Lower Extremity Assessment Lower Extremity Assessment: LLE deficits/detail       Communication   Communication: No difficulties  Cognition Arousal/Alertness: Awake/alert Behavior During Therapy: WFL for tasks assessed/performed Overall Cognitive Status: Within Functional Limits for tasks assessed  General Comments      Exercises     Assessment/Plan    PT Assessment Patient needs continued PT services  PT Problem List Decreased strength;Decreased range of motion;Decreased activity  tolerance;Decreased balance;Decreased mobility;Decreased knowledge of use of DME;Pain       PT Treatment Interventions DME instruction;Gait training;Stair training;Therapeutic activities;Functional mobility training;Therapeutic exercise;Patient/family education    PT Goals (Current goals can be found in the Care Plan section)  Acute Rehab PT Goals Patient Stated Goal: Regain IND PT Goal Formulation: With patient Time For Goal Achievement: 12/17/17 Potential to Achieve Goals: Good    Frequency 7X/week   Barriers to discharge        Co-evaluation               AM-PAC PT "6 Clicks" Daily Activity  Outcome Measure Difficulty turning over in bed (including adjusting bedclothes, sheets and blankets)?: Unable Difficulty moving from lying on back to sitting on the side of the bed? : Unable Difficulty sitting down on and standing up from a chair with arms (e.g., wheelchair, bedside commode, etc,.)?: Unable Help needed moving to and from a bed to chair (including a wheelchair)?: A Lot Help needed walking in hospital room?: A Lot Help needed climbing 3-5 steps with a railing? : Total 6 Click Score: 8    End of Session Equipment Utilized During Treatment: Gait belt;Left knee immobilizer Activity Tolerance: Patient limited by fatigue;Other (comment)(nausea and dizziness) Patient left: in bed;with call bell/phone within reach;with family/visitor present Nurse Communication: Mobility status PT Visit Diagnosis: Difficulty in walking, not elsewhere classified (R26.2)    Time: 1610-9604 PT Time Calculation (min) (ACUTE ONLY): 22 min   Charges:   PT Evaluation $PT Eval Low Complexity: [place] Acute [facility] Services Pager [phone] Office [phone]   Jalen Oberry 12/10/2017, 5:43 PM

## 2017-12-10 NOTE — Discharge Instructions (Signed)

## 2017-12-10 NOTE — Interval H&P Note (Signed)
History and Physical Interval Note:  12/10/2017 9:59 AM  [name]  has presented today for surgery, with the diagnosis of LEFT KNEEOSTEOARTHRITIS  The various methods of treatment have been discussed with the patient and family. After consideration of risks, benefits and other options for treatment, the patient has consented to  Procedure(s): TOTAL KNEE ARTHROPLASTY (Left) as a surgical intervention .  The patient's history has been reviewed, patient examined, no change in status, stable for surgery.  I have reviewed the patient's chart and labs.  Questions were answered to the patient's satisfaction.     [name]

## 2017-12-10 NOTE — Transfer of Care (Signed)
Immediate Anesthesia Transfer of Care Note  Patient: [name]  Procedure(s) Performed: TOTAL KNEE ARTHROPLASTY (Left Knee)  Patient Location: PACU  Anesthesia Type:Spinal  Level of Consciousness: awake, alert  and oriented  Airway & Oxygen Therapy: Patient Spontanous Breathing and Patient connected to nasal cannula oxygen  Post-op Assessment: Report given to RN and Post -op Vital signs reviewed and stable  Post vital signs: Reviewed and stable  Last Vitals:  Vitals Value Taken Time  BP 118/63 12/10/2017 12:01 PM  Temp    Pulse 66 12/10/2017 12:03 PM  Resp 17 12/10/2017 12:03 PM  SpO2 100 % 12/10/2017 12:03 PM  Vitals shown include unvalidated device data.  Last Pain:  Vitals:   12/10/17 0953  TempSrc:   PainSc: 0-No pain         Complications: No apparent anesthesia complications

## 2017-12-10 NOTE — Anesthesia Procedure Notes (Signed)
Anesthesia Regional Block: Adductor canal block   Pre-Anesthetic Checklist: ,, timeout performed, Correct Patient, Correct Site, Correct Laterality, Correct Procedure, Correct Position, site marked, Risks and benefits discussed, pre-op evaluation,  At surgeon's request and post-op pain management  Laterality: Left  Prep: Maximum Sterile Barrier Precautions used, chloraprep       Needles:  Injection technique: Single-shot  Needle Type: Echogenic Stimulator Needle     Needle Length: 9cm  Needle Gauge: 21     Additional Needles:   Procedures:,,,, ultrasound used (permanent image in chart),,,,  Narrative:  Start time: 12/10/2017 9:45 AM End time: 12/10/2017 9:55 AM Injection made incrementally with aspirations every 5 mL.  Performed by: Personally  Anesthesiologist: Roberts Gaudy, MD  Additional Notes: 15 cc 0.5% Bupivacaine with 1:200 Epi and 10 cc exparel injected easily

## 2017-12-10 NOTE — Anesthesia Postprocedure Evaluation (Signed)
Anesthesia Post Note  Patient: International aid/development worker  Procedure(s) Performed: TOTAL KNEE ARTHROPLASTY (Left Knee)     Patient location during evaluation: Nursing Unit Anesthesia Type: Spinal Level of consciousness: awake, oriented, awake and alert and patient cooperative Pain management: satisfactory to patient Vital Signs Assessment: post-procedure vital signs reviewed and stable Respiratory status: spontaneous breathing, respiratory function stable and nonlabored ventilation Cardiovascular status: blood pressure returned to baseline Anesthetic complications: no    Last Vitals:  Vitals:   12/10/17 1634 12/10/17 1753  BP: 140/68 130/64  Pulse: 68 64  Resp: 16 16  Temp: 36.6 C (!) 36.4 C  SpO2: 99% 100%    Last Pain:  Vitals:   12/10/17 1753  TempSrc: Oral  PainSc:                  Marilin Kofman COKER

## 2017-12-10 NOTE — Brief Op Note (Signed)
12/10/2017  12:00 PM  PATIENT:  [name]  77 y.o. female  PRE-OPERATIVE DIAGNOSIS:  LEFT KNEEOSTEOARTHRITIS  POST-OPERATIVE DIAGNOSIS:  LEFT KNEEOSTEOARTHRITIS  PROCEDURE:  Procedure(s): TOTAL KNEE ARTHROPLASTY (Left)  SURGEON:  Surgeon(s) and Role:    Dorna Leitz, MD - Primary  PHYSICIAN ASSISTANT:   ASSISTANTS: bethune   ANESTHESIA:   spinal  EBL:  50 mL   BLOOD ADMINISTERED:none  DRAINS: none   LOCAL MEDICATIONS USED:  MARCAINE    and OTHER experel  SPECIMEN:  No Specimen  DISPOSITION OF SPECIMEN:  N/A  COUNTS:  YES  TOURNIQUET:   Total Tourniquet Time Documented: Thigh (Left) - 54 minutes Total: Thigh (Left) - 54 minutes   DICTATION: .Other Dictation: Dictation Number [id number]  PLAN OF CARE: Admit to inpatient   PATIENT DISPOSITION:  PACU - hemodynamically stable.   Delay start of Pharmacological VTE agent (>24hrs) due to surgical blood loss or risk of bleeding: no

## 2017-12-10 NOTE — Anesthesia Procedure Notes (Signed)
Date/Time: 12/10/2017 10:02 AM Performed by: Sharlette Dense, CRNA Oxygen Delivery Method: Simple face mask

## 2017-12-11 LAB — CBC
HEMATOCRIT: 38 % (ref 36.0–46.0)
HEMOGLOBIN: 12.1 g/dL (ref 12.0–15.0)
MCH: 30 pg (ref 26.0–34.0)
MCHC: 31.8 g/dL (ref 30.0–36.0)
MCV: 94.1 fL (ref 80.0–100.0)
NRBC: 0 % (ref 0.0–0.2)
Platelets: 147 10*3/uL — ABNORMAL LOW (ref 150–400)
RBC: 4.04 MIL/uL (ref 3.87–5.11)
RDW: 13 % (ref 11.5–15.5)
WBC: 8.8 10*3/uL (ref 4.0–10.5)

## 2017-12-11 NOTE — Plan of Care (Signed)
Pt stable with no needs. No changes to note. No s/s of distress. Pt is overall confused. Rn will monitor this. Family at bedside

## 2017-12-11 NOTE — Progress Notes (Signed)
Physical Therapy Treatment Patient Details Name: [name] MRN: [id number] DOB: 25-Oct-1940 Today's Date: 12/11/2017    History of Present Illness Pt s/p L TKR     PT Comments    Pt motivated and with marked improvement in activity tolerance this am and with no c/o nausea or dizziness.     Follow Up Recommendations  Home health PT;Follow surgeon's recommendation for DC plan and follow-up therapies     Equipment Recommendations  None recommended by PT    Recommendations for Other Services       Precautions / Restrictions Precautions Precautions: Knee;Fall Required Braces or Orthoses: Knee Immobilizer - Left Knee Immobilizer - Left: Discontinue once straight leg raise with < 10 degree lag(Pt performed IND SLR this am) Restrictions Weight Bearing Restrictions: No Other Position/Activity Restrictions: WBAT    Mobility  Bed Mobility Overal bed mobility: Needs Assistance Bed Mobility: Supine to Sit     Supine to sit: Min assist     General bed mobility comments: cues for sequence and use of L LE to self assist  Transfers Overall transfer level: Needs assistance Equipment used: Rolling walker (2 wheeled) Transfers: Sit to/from Stand              Ambulation/Gait Ambulation/Gait assistance: Min assist;+2 safety/equipment Gait Distance (Feet): 70 Feet Assistive device: Rolling walker (2 wheeled) Gait Pattern/deviations: Step-to pattern;Decreased step length - right;Decreased step length - left;Shuffle;Trunk flexed Gait velocity: decr   General Gait Details: cues for sequence, posture and position from [employer]             Wheelchair Mobility    Modified Rankin (Stroke Patients Only)       Balance Overall balance assessment: Needs assistance Sitting-balance support: No upper extremity supported;Feet supported Sitting balance-Leahy Scale: Good     Standing balance support: Bilateral upper extremity supported Standing balance-Leahy  Scale: Fair                              Cognition Arousal/Alertness: Awake/alert Behavior During Therapy: WFL for tasks assessed/performed Overall Cognitive Status: History of cognitive impairments - at baseline                                 General Comments: Pt states "I am getting very forgetful"      Exercises Total Joint Exercises Ankle Circles/Pumps: AROM;Both;15 reps;Supine Quad Sets: AROM;Both;15 reps;Supine Heel Slides: AAROM;Left;15 reps;Supine Straight Leg Raises: AAROM;AROM;Left;15 reps;Supine Goniometric ROM: AAROM L knee -8 - 80    General Comments        Pertinent Vitals/Pain Pain Assessment: 0-10 Pain Score: 5  Pain Location: L knee Pain Descriptors / Indicators: Aching;Sore;Spasm Pain Intervention(s): Limited activity within patient's tolerance;Monitored during session;Premedicated before session;Ice applied    Home Living                      Prior Function            PT Goals (current goals can now be found in the care plan section) Acute Rehab PT Goals Patient Stated Goal: Regain IND PT Goal Formulation: With patient Time For Goal Achievement: 12/17/17 Potential to Achieve Goals: Good Progress towards PT goals: Progressing toward goals    Frequency    7X/week      PT Plan Current plan remains appropriate    Co-evaluation  AM-PAC PT "6 Clicks" Mobility   Outcome Measure  Help needed turning from your back to your side while in a flat bed without using bedrails?: A Little Help needed moving from lying on your back to sitting on the side of a flat bed without using bedrails?: A Little Help needed moving to and from a bed to a chair (including a wheelchair)?: A Little Help needed standing up from a chair using your arms (e.g., wheelchair or bedside chair)?: A Little Help needed to walk in hospital room?: A Little Help needed climbing 3-5 steps with a railing? : A Lot 6 Click Score:  17    End of Session Equipment Utilized During Treatment: Gait belt;Left knee immobilizer Activity Tolerance: Patient tolerated treatment well Patient left: in chair;with call bell/phone within reach;with family/visitor present;with chair alarm set Nurse Communication: Mobility status PT Visit Diagnosis: Difficulty in walking, not elsewhere classified (R26.2)     Time: 6808-8110 PT Time Calculation (min) (ACUTE ONLY): 27 min  Charges:  $Gait Training: 8-22 mins $Therapeutic Exercise: 8-22 mins                     [place] Pager [phone] Office [phone]    Jenille Laszlo 12/11/2017, 1:04 PM

## 2017-12-11 NOTE — Care Management Note (Signed)
Case Management Note  Patient Details  Name: [name] MRN: [id number] Date of Birth: 06/08/1940  Subjective/Objective: 77 yo F s/p L TKR                   Action/Plan: Received referral to assist with DME and [facility]   Expected Discharge Date:    12/12/17              Expected Discharge Plan:  [place]  In-House Referral:     Discharge planning Services  CM Consult  Post Acute Care Choice:  Home Health Choice offered to:  Spouse, Patient  DME Arranged:    DME Agency:     HH Arranged:  PT [place]:  [place] (now [facility])  Status of Service:     If discussed at [employer] of Stay Meetings, dates discussed:    Additional Comments: Met with pt and husband. She plans to stay with her daughter at time of D/C. Daughter's address: [address] [place], [place] [address]. Husband's cell phone [phone]. She reports that the DME (RW and 3-in-BSC) has been delivered. Discussed preference for a [facility] agency. Referral made go [facility] by the physician's office and they agreed with agency.  Norina Buzzard, RN 12/11/2017, 2:23 PM

## 2017-12-11 NOTE — Addendum Note (Signed)
Addendum  created 12/11/17 0921 by Roberts Gaudy, MD   Child order released for a procedure order, Intraprocedure Blocks edited, Order Canceled from Note, Sign clinical note

## 2017-12-11 NOTE — Progress Notes (Signed)
Physical Therapy Treatment Patient Details Name: [name] MRN: [id number] DOB: [birth year] Today's Date: 12/11/2017    History of Present Illness Pt s/p L TKR     PT Comments    Pt continues to progress steadily with mobility.  Spouse present for session.  Pt hopes to dc home tomorrow with dtr and spouse.   Follow Up Recommendations  Home health PT;Follow surgeon's recommendation for DC plan and follow-up therapies     Equipment Recommendations  None recommended by PT    Recommendations for Other Services       Precautions / Restrictions Precautions Precautions: Knee;Fall Required Braces or Orthoses: Knee Immobilizer - Left Knee Immobilizer - Left: Discontinue once straight leg raise with < 10 degree lag Restrictions Weight Bearing Restrictions: No Other Position/Activity Restrictions: WBAT    Mobility  Bed Mobility Overal bed mobility: Needs Assistance Bed Mobility: Sit to Supine       Sit to supine: Min guard   General bed mobility comments: cues for sequence and use of L LE to self assist  Transfers Overall transfer level: Needs assistance Equipment used: Rolling walker (2 wheeled) Transfers: Sit to/from Stand Sit to Stand: Min assist;Min guard         General transfer comment: cues for LE management and use of UEs to self assist  Ambulation/Gait Ambulation/Gait assistance: Min assist;Min guard Gait Distance (Feet): 140 Feet Assistive device: Rolling walker (2 wheeled) Gait Pattern/deviations: Step-to pattern;Decreased step length - right;Decreased step length - left;Shuffle;Trunk flexed Gait velocity: decr   General Gait Details: cues for sequence, posture and position from [employer]             Wheelchair Mobility    Modified Rankin (Stroke Patients Only)       Balance Overall balance assessment: Needs assistance Sitting-balance support: No upper extremity supported;Feet supported Sitting balance-Leahy Scale: Good      Standing balance support: Bilateral upper extremity supported Standing balance-Leahy Scale: Fair                              Cognition Arousal/Alertness: Awake/alert Behavior During Therapy: WFL for tasks assessed/performed Overall Cognitive Status: History of cognitive impairments - at baseline                                 General Comments: Pt states "I am getting very forgetful"      Exercises      General Comments        Pertinent Vitals/Pain Pain Assessment: 0-10 Pain Score: 5  Pain Location: L knee Pain Descriptors / Indicators: Aching;Sore;Spasm Pain Intervention(s): Limited activity within patient's tolerance;Monitored during session;Premedicated before session;Ice applied    Home Living                      Prior Function            PT Goals (current goals can now be found in the care plan section) Acute Rehab PT Goals Patient Stated Goal: Regain IND PT Goal Formulation: With patient Time For Goal Achievement: 12/17/17 Potential to Achieve Goals: Good Progress towards PT goals: Progressing toward goals    Frequency    7X/week      PT Plan Current plan remains appropriate    Co-evaluation              AM-PAC PT "6 Clicks"  Mobility   Outcome Measure  Help needed turning from your back to your side while in a flat bed without using bedrails?: A Little Help needed moving from lying on your back to sitting on the side of a flat bed without using bedrails?: A Little Help needed moving to and from a bed to a chair (including a wheelchair)?: A Little Help needed standing up from a chair using your arms (e.g., wheelchair or bedside chair)?: A Little Help needed to walk in hospital room?: A Little Help needed climbing 3-5 steps with a railing? : A Lot 6 Click Score: 17    End of Session Equipment Utilized During Treatment: Gait belt Activity Tolerance: Patient tolerated treatment well Patient left: in  bed;with call bell/phone within reach;with family/visitor present Nurse Communication: Mobility status PT Visit Diagnosis: Difficulty in walking, not elsewhere classified (R26.2)     Time: 8206-0156 PT Time Calculation (min) (ACUTE ONLY): 20 min  Charges:  $Gait Training: 8-22 mins                     [name] Pager [phone] Office [phone]    [name] 12/11/2017, 5:12 PM

## 2017-12-11 NOTE — Progress Notes (Signed)
  PATIENT ID: [name]  MRN: [id number]  DOB/AGE:  1940/10/29 / 77 y.o.  1 Day Post-Op Procedure(s) (LRB): TOTAL KNEE ARTHROPLASTY (Left)  Subjective: Pain is none.  No c/o chest pain or SOB.   Not had PT yet, but can SLR in bed, presently eating breakfast   Objective: Vital signs in last 24 hours: Temp:  [97.5 F (36.4 C)-98.4 F (36.9 C)] 97.8 F (36.6 C) (11/23 0921) Pulse Rate:  [56-70] 62 (11/23 0921) Resp:  [12-19] 16 (11/23 0921) BP: (101-153)/(49-70) 107/59 (11/23 0921) SpO2:  [98 %-100 %] 100 % (11/23 0921)  Intake/Output from previous day: 11/22 0701 - 11/23 0700 In: 2661.7 [I.V.:2211.7; IV Piggyback:450] Out: 9528 [Urine:3550; Blood:50] Intake/Output this shift: Total I/O In: 360 [P.O.:360] Out: 50 [Urine:50]  Recent Labs    12/10/17 0825 12/11/17 0353  HGB 15.5* 12.1   Recent Labs    12/10/17 0825 12/11/17 0353  WBC 3.9* 8.8  RBC 5.14* 4.04  HCT 48.5* 38.0  PLT 183 147*   Recent Labs    12/10/17 0825  NA 144  K 4.1  CL 106  CO2 31  BUN 20  CREATININE 0.83  GLUCOSE 93  CALCIUM 9.6   Recent Labs    12/10/17 0825  INR 0.91    Physical Exam: Sensation intact distally Intact pulses distally Dorsiflexion/Plantar flexion intact Incision: dressing C/D/I Compartment soft  Assessment/Plan: 1 Day Post-Op Procedure(s) (LRB): TOTAL KNEE ARTHROPLASTY (Left)   Advance diet Up with therapy D/C IV fluids Plan for discharge tomorrow Weight Bearing as Tolerated (WBAT)  VTE prophylaxis: intermittent pneumatic compression boots and ASA   Shanon Brow A. [place] [address], [place], [place]  [address] Office: [phone] Mobile: [phone]  12/11/2017, 9:31 AM

## 2017-12-11 NOTE — Progress Notes (Signed)
Anesthesiology follow-up:  77 year old female status post left total knee replacement yesterday.  She had adductor canal block with 10 cc of Exparel and 15 cc of 0.5% bupivacaine with epinephrine.  She appears comfortable with minimal pain.  Last pain medicine dose was Vicodin at 8:45 PM yesterday.  She has not had any weightbearing yet awaiting PT evaluation.  Roberts Gaudy

## 2017-12-12 LAB — CBC
HCT: 37.5 % (ref 36.0–46.0)
Hemoglobin: 12.2 g/dL (ref 12.0–15.0)
MCH: 30 pg (ref 26.0–34.0)
MCHC: 32.5 g/dL (ref 30.0–36.0)
MCV: 92.1 fL (ref 80.0–100.0)
NRBC: 0 % (ref 0.0–0.2)
Platelets: 158 10*3/uL (ref 150–400)
RBC: 4.07 MIL/uL (ref 3.87–5.11)
RDW: 13.1 % (ref 11.5–15.5)
WBC: 9.3 10*3/uL (ref 4.0–10.5)

## 2017-12-12 NOTE — Discharge Summary (Signed)
Physician Discharge Summary  Patient ID: [name] MRN: [id number] DOB/AGE: 03-13-1940 77 y.o.  Admit date: 12/10/2017 Discharge date: 12/12/2017  Admission Diagnoses:  Primary osteoarthritis of left knee  Discharge Diagnoses:  Principal Problem:   Primary osteoarthritis of left knee   Past Medical History:  Diagnosis Date  . Hypothyroidism   . Rheumatic fever    as a kid    Surgeries: Procedure(s): TOTAL KNEE ARTHROPLASTY on 12/10/2017   Consultants (if any):   Discharged Condition: Improved  Hospital Course: [name] is an 77 y.o. female who was admitted 12/10/2017 with a diagnosis of Primary osteoarthritis of left knee and went to the operating room on 12/10/2017 and underwent the above named procedures.    She was given perioperative antibiotics:  Anti-infectives (From admission, onward)   Start     Dose/Rate Route Frequency Ordered Stop   12/10/17 1600  ceFAZolin (ANCEF) IVPB 2g/100 mL premix     2 g 200 mL/hr over 30 Minutes Intravenous Every 6 hours 12/10/17 1436 12/10/17 2116   12/10/17 0745  ceFAZolin (ANCEF) IVPB 2g/100 mL premix     2 g 200 mL/hr over 30 Minutes Intravenous On call to O.R. 12/10/17 7253 12/10/17 1014    .  She was given sequential compression devices, early ambulation, and ASA for DVT prophylaxis.  She benefited maximally from the hospital stay and there were no complications.  At the time of d/c, was tol PO, ambulating with PT, pain controlled on oral meds, NVI, calf soft, with scant spotting on dressing.  Recent vital signs:  Vitals:   12/11/17 2206 12/12/17 0606  BP: 135/70 (!) 119/53  Pulse: (!) 59 62  Resp:  16  Temp: 98.1 F (36.7 C) (!) 97.4 F (36.3 C)  SpO2: 100% 98%    Recent laboratory studies:  Lab Results  Component Value Date   HGB 12.2 12/12/2017   HGB 12.1 12/11/2017   HGB 15.5 (H) 12/10/2017   Lab Results  Component Value Date   WBC 9.3 12/12/2017   PLT 158 12/12/2017   Lab Results   Component Value Date   INR 0.91 12/10/2017   Lab Results  Component Value Date   NA 144 12/10/2017   K 4.1 12/10/2017   CL 106 12/10/2017   CO2 31 12/10/2017   BUN 20 12/10/2017   CREATININE 0.83 12/10/2017   GLUCOSE 93 12/10/2017    Discharge Medications:   Allergies as of 12/12/2017   No Known Allergies     Medication List    TAKE these medications   aspirin EC 325 MG tablet Take 1 tablet (325 mg total) by mouth 2 (two) times daily after a meal. Take x 1 month post op to decrease risk of blood clots.   CALCIUM ACETATE-MAGNESIUM CARB PO Take 1 tablet by mouth daily.   co-enzyme Q-10 30 MG capsule Take 30 mg by mouth daily.   docusate sodium 100 MG capsule Commonly known as:  COLACE Take 1 capsule (100 mg total) by mouth 2 (two) times daily.   doxycycline 50 MG capsule Commonly known as:  VIBRAMYCIN Take 50 mg by mouth daily.   famotidine-calcium carbonate-magnesium hydroxide 10-800-165 MG chewable tablet Commonly known as:  PEPCID COMPLETE Chew 3 tablets by mouth daily.   Fish Oil 1200 MG Cpdr Take 1 capsule by mouth daily.   Grape Seed Extract 50 MG Caps Take 4 capsules by mouth daily.   HYDROcodone-acetaminophen 5-325 MG tablet Commonly known as:  NORCO/VICODIN Take 1-2 tablets by  mouth every 6 (six) hours as needed for moderate pain.   levothyroxine 75 MCG tablet Commonly known as:  SYNTHROID, LEVOTHROID Take 75 mcg by mouth daily before breakfast.   multivitamin with minerals Tabs tablet Take 1 tablet by mouth daily.   niacin 500 MG tablet Take 500 mg by mouth daily.   tiZANidine 2 MG tablet Commonly known as:  ZANAFLEX Take 1 tablet (2 mg total) by mouth every 8 (eight) hours as needed for muscle spasms.   VISION FORMULA/LUTEIN Tabs Take 1 tablet by mouth daily.   Zinc 15 MG Caps Take 1 capsule by mouth daily.       Diagnostic Studies: Dg Chest 2 View  Result Date: 12/10/2017 CLINICAL DATA:  Preop total knee replacement EXAM:  CHEST - 2 VIEW COMPARISON:  None. FINDINGS: Heart and mediastinal contours are within normal limits. No focal opacities or effusions. No acute bony abnormality. IMPRESSION: No active cardiopulmonary disease. Electronically Signed   By: Rolm Baptise M.D.   On: 12/10/2017 08:55    Disposition:     Follow-up Information    Dorna Leitz, MD. Schedule an appointment as soon as possible for a visit in 2 weeks.   Specialty:  Orthopedic Surgery Contact information: [place] [address] [phone]        [facility] up.   Specialty:  Home Health Services Why:  [facility] with provide home physical therapy. Contact information: [address] [place] [address] [phone]            Signed: Jolyn Nap 12/12/2017, 10:13 AM

## 2017-12-12 NOTE — Progress Notes (Signed)
Physical Therapy Treatment Patient Details Name: Lavaya Defreitas MRN: [id number] DOB: 1940/12/09 Today's Date: 12/12/2017    History of Present Illness Pt s/p L TKR     PT Comments    Pt seen for PM PT session. Pt received in bed in CPM. Ambulated 225 feet with RW min guard assist. All education complete. All questions answered. Pt to d/c home today.   Follow Up Recommendations  Home health PT;Follow surgeon's recommendation for DC plan and follow-up therapies     Equipment Recommendations  None recommended by PT    Recommendations for Other Services       Precautions / Restrictions Precautions Precautions: Knee;Fall Precaution Comments: reviewed no pillow under knee Restrictions Weight Bearing Restrictions: Yes LLE Weight Bearing: Weight bearing as tolerated Other Position/Activity Restrictions: knee immobilizer d/c'd 12/12/17    Mobility  Bed Mobility   Bed Mobility: Sit to Supine     Supine to sit: Supervision;HOB elevated Sit to supine: Min guard   General bed mobility comments: +rail, increased time and effort  Transfers Overall transfer level: Needs assistance Equipment used: Rolling walker (2 wheeled) Transfers: Sit to/from Stand Sit to Stand: Min guard         General transfer comment: increased time to stabilize initial standing balance, cues for hand placement  Ambulation/Gait Ambulation/Gait assistance: Min guard Gait Distance (Feet): 225 Feet Assistive device: Rolling walker (2 wheeled) Gait Pattern/deviations: Step-through pattern;Decreased stride length;Antalgic;Decreased weight shift to left Gait velocity: decreased Gait velocity interpretation: <1.31 ft/sec, indicative of household ambulator General Gait Details: cues for sequencing and step length   Stairs Stairs: Yes Stairs assistance: Min assist Stair Management: One rail Left;Forwards Number of Stairs: 5(x 2) General stair comments: L rail and R HHA, cues for  sequencing   Wheelchair Mobility    Modified Rankin (Stroke Patients Only)       Balance   Sitting-balance support: No upper extremity supported;Feet supported Sitting balance-Leahy Scale: Good     Standing balance support: Bilateral upper extremity supported;During functional activity Standing balance-Leahy Scale: Fair                              Cognition Arousal/Alertness: Awake/alert Behavior During Therapy: WFL for tasks assessed/performed Overall Cognitive Status: History of cognitive impairments - at baseline                                        Exercises Total Joint Exercises Ankle Circles/Pumps: AROM;Both;20 reps Quad Sets: AROM;Both;20 reps Heel Slides: AAROM;Left;10 reps Hip ABduction/ADduction: AROM;Left;10 reps Straight Leg Raises: AAROM;Left;5 reps Goniometric ROM: 0-90 L knee    General Comments        Pertinent Vitals/Pain Pain Assessment: 0-10 Pain Score: 7  Pain Location: L knee Pain Descriptors / Indicators: Aching;Sore;Spasm Pain Intervention(s): Monitored during session;Repositioned    Home Living                      Prior Function            PT Goals (current goals can now be found in the care plan section) Acute Rehab PT Goals Patient Stated Goal: independence PT Goal Formulation: With patient Time For Goal Achievement: 12/17/17 Potential to Achieve Goals: Good Progress towards PT goals: Progressing toward goals    Frequency    7X/week      PT Plan  Current plan remains appropriate    Co-evaluation              AM-PAC PT "6 Clicks" Mobility   Outcome Measure  Help needed turning from your back to your side while in a flat bed without using bedrails?: None Help needed moving from lying on your back to sitting on the side of a flat bed without using bedrails?: A Little Help needed moving to and from a bed to a chair (including a wheelchair)?: A Little Help needed standing  up from a chair using your arms (e.g., wheelchair or bedside chair)?: A Little Help needed to walk in hospital room?: A Little Help needed climbing 3-5 steps with a railing? : A Little 6 Click Score: 19    End of Session Equipment Utilized During Treatment: Gait belt Activity Tolerance: Patient tolerated treatment well Patient left: in chair;with call bell/phone within reach;with family/visitor present Nurse Communication: Mobility status PT Visit Diagnosis: Difficulty in walking, not elsewhere classified (R26.2)     Time: 6237-6283 PT Time Calculation (min) (ACUTE ONLY): 21 min  Charges:  $Gait Training: 8-22 mins $Therapeutic Exercise: 8-22 mins                     Lorrin Goodell, PT  Office # [phone] Pager [phone]    Lorriane Shire 12/12/2017, 1:39 PM

## 2017-12-12 NOTE — Progress Notes (Signed)
Physical Therapy Treatment Patient Details Name: [name] MRN: [id number] DOB: October 21, 1940 Today's Date: 12/12/2017    History of Present Illness Pt s/p L TKR     PT Comments    Good progress with mobility. Pt ambulated 200 feet min guard assist. Stair training complete. Pt performed LE exercises in bed prior to being placed in CPM.   Follow Up Recommendations  Home health PT;Follow surgeon's recommendation for DC plan and follow-up therapies     Equipment Recommendations  None recommended by PT    Recommendations for Other Services       Precautions / Restrictions Precautions Precautions: Knee;Fall Precaution Comments: reviewed no pillow under knee Restrictions Weight Bearing Restrictions: No Other Position/Activity Restrictions: WBAT    Mobility  Bed Mobility   Bed Mobility: Sit to Supine       Sit to supine: Min guard   General bed mobility comments: pt assisted LLE into bed with RLE, increased time and effort  Transfers Overall transfer level: Needs assistance Equipment used: Rolling walker (2 wheeled) Transfers: Sit to/from Stand Sit to Stand: Min guard         General transfer comment: increased time to stabilize initial standing balance  Ambulation/Gait Ambulation/Gait assistance: Min guard Gait Distance (Feet): 200 Feet Assistive device: Rolling walker (2 wheeled) Gait Pattern/deviations: Step-through pattern;Decreased stride length;Antalgic;Decreased weight shift to left Gait velocity: decreased   General Gait Details: cues for sequencing and step length   Stairs Stairs: Yes Stairs assistance: Min assist Stair Management: One rail Left;Forwards Number of Stairs: 5(x 2) General stair comments: L rail and R HHA, cues for sequencing   Wheelchair Mobility    Modified Rankin (Stroke Patients Only)       Balance   Sitting-balance support: No upper extremity supported;Feet supported Sitting balance-Leahy Scale: Good      Standing balance support: Bilateral upper extremity supported;During functional activity Standing balance-Leahy Scale: Fair                              Cognition Arousal/Alertness: Awake/alert Behavior During Therapy: WFL for tasks assessed/performed Overall Cognitive Status: History of cognitive impairments - at baseline                                        Exercises Total Joint Exercises Ankle Circles/Pumps: AROM;Both;20 reps Quad Sets: AROM;Both;20 reps Heel Slides: AAROM;Left;10 reps Hip ABduction/ADduction: AROM;Left;10 reps Straight Leg Raises: AAROM;Left;5 reps Goniometric ROM: 0-90 L knee    General Comments        Pertinent Vitals/Pain Pain Assessment: 0-10 Pain Score: 6  Pain Location: L knee Pain Descriptors / Indicators: Aching;Sore;Spasm Pain Intervention(s): Monitored during session;Repositioned;Ice applied    Home Living                      Prior Function            PT Goals (current goals can now be found in the care plan section) Acute Rehab PT Goals Patient Stated Goal: independence PT Goal Formulation: With patient Time For Goal Achievement: 12/17/17 Potential to Achieve Goals: Good Progress towards PT goals: Progressing toward goals    Frequency    7X/week      PT Plan Current plan remains appropriate    Co-evaluation              AM-PAC  PT "6 Clicks" Mobility   Outcome Measure  Help needed turning from your back to your side while in a flat bed without using bedrails?: None Help needed moving from lying on your back to sitting on the side of a flat bed without using bedrails?: A Little Help needed moving to and from a bed to a chair (including a wheelchair)?: A Little Help needed standing up from a chair using your arms (e.g., wheelchair or bedside chair)?: A Little Help needed to walk in hospital room?: A Little Help needed climbing 3-5 steps with a railing? : A Little 6 Click  Score: 19    End of Session Equipment Utilized During Treatment: Gait belt Activity Tolerance: Patient tolerated treatment well Patient left: in bed;with call bell/phone within reach;in CPM;with family/visitor present Nurse Communication: Mobility status PT Visit Diagnosis: Difficulty in walking, not elsewhere classified (R26.2)     Time: 4854-6270 PT Time Calculation (min) (ACUTE ONLY): 42 min  Charges:  $Gait Training: 23-37 mins $Therapeutic Exercise: 8-22 mins                     Lorrin Goodell, PT  Office # [phone] Pager [phone]    Lorriane Shire 12/12/2017, 11:02 AM

## 2017-12-12 NOTE — Progress Notes (Signed)
Reviewed d/c instructions and prescriptions with patient and husband. Both verbalized understanding of instructions. Awaiting second therapy session. Will cont to monitor.

## 2017-12-13 ENCOUNTER — Encounter (HOSPITAL_COMMUNITY): Payer: Self-pay | Admitting: Orthopedic Surgery

## 2017-12-23 ENCOUNTER — Other Ambulatory Visit: Payer: Self-pay

## 2017-12-23 ENCOUNTER — Encounter (HOSPITAL_COMMUNITY): Payer: Self-pay

## 2017-12-23 ENCOUNTER — Emergency Department (HOSPITAL_COMMUNITY)
Admission: EM | Admit: 2017-12-23 | Discharge: 2017-12-23 | Disposition: A | Discharge status: Home / Self Care | Payer: Medicare HMO | Attending: Emergency Medicine | Admitting: Emergency Medicine

## 2017-12-23 DIAGNOSIS — Z79899 Other long term (current) drug therapy: Secondary | ICD-10-CM | POA: Insufficient documentation

## 2017-12-23 DIAGNOSIS — Z7982 Long term (current) use of aspirin: Secondary | ICD-10-CM | POA: Diagnosis not present

## 2017-12-23 DIAGNOSIS — R112 Nausea with vomiting, unspecified: Secondary | ICD-10-CM | POA: Diagnosis not present

## 2017-12-23 DIAGNOSIS — E039 Hypothyroidism, unspecified: Secondary | ICD-10-CM | POA: Insufficient documentation

## 2017-12-23 DIAGNOSIS — R55 Syncope and collapse: Secondary | ICD-10-CM

## 2017-12-23 DIAGNOSIS — R600 Localized edema: Secondary | ICD-10-CM | POA: Insufficient documentation

## 2017-12-23 LAB — BASIC METABOLIC PANEL
ANION GAP: 7 (ref 5–15)
BUN: 20 mg/dL (ref 8–23)
CALCIUM: 8.8 mg/dL — AB (ref 8.9–10.3)
CO2: 25 mmol/L (ref 22–32)
Chloride: 109 mmol/L (ref 98–111)
Creatinine, Ser: 0.84 mg/dL (ref 0.44–1.00)
GLUCOSE: 135 mg/dL — AB (ref 70–99)
POTASSIUM: 4.1 mmol/L (ref 3.5–5.1)
Sodium: 141 mmol/L (ref 135–145)

## 2017-12-23 LAB — CBC WITH DIFFERENTIAL/PLATELET
ABS IMMATURE GRANULOCYTES: 0.05 10*3/uL (ref 0.00–0.07)
BASOS PCT: 0 %
Basophils Absolute: 0 10*3/uL (ref 0.0–0.1)
EOS ABS: 0 10*3/uL (ref 0.0–0.5)
Eosinophils Relative: 0 %
HCT: 44.8 % (ref 36.0–46.0)
Hemoglobin: 13.6 g/dL (ref 12.0–15.0)
Immature Granulocytes: 1 %
Lymphocytes Relative: 10 %
Lymphs Abs: 0.8 10*3/uL (ref 0.7–4.0)
MCH: 29.6 pg (ref 26.0–34.0)
MCHC: 30.4 g/dL (ref 30.0–36.0)
MCV: 97.6 fL (ref 80.0–100.0)
Monocytes Absolute: 0.4 10*3/uL (ref 0.1–1.0)
Monocytes Relative: 5 %
NEUTROS ABS: 7.2 10*3/uL (ref 1.7–7.7)
NEUTROS PCT: 84 %
NRBC: 0 % (ref 0.0–0.2)
PLATELETS: 231 10*3/uL (ref 150–400)
RBC: 4.59 MIL/uL (ref 3.87–5.11)
RDW: 14 % (ref 11.5–15.5)
WBC: 8.5 10*3/uL (ref 4.0–10.5)

## 2017-12-23 LAB — TROPONIN I: Troponin I: <0.03 ng/mL (ref <0.03)

## 2017-12-23 MED ORDER — ACETAMINOPHEN 325 MG PO TABS
650.0000 mg | ORAL_TABLET | Freq: Once | ORAL | Status: AC
  Administered 2017-12-23: 650 mg via ORAL
  Filled 2017-12-23: qty 2

## 2017-12-23 MED ORDER — SODIUM CHLORIDE 0.9 % IV BOLUS
1000.0000 mL | Freq: Once | INTRAVENOUS | Status: AC
  Administered 2017-12-23: 1000 mL via INTRAVENOUS

## 2017-12-23 NOTE — ED Provider Notes (Signed)
[facility] DEPT Provider Note   CSN: [id number] Arrival date & time: 12/23/17  0805     History   Chief Complaint Chief Complaint  Patient presents with  . Loss of Consciousness    HPI [name] is a 77 y.o. female.  She presents by EMS after a syncopal event at home.  She had a recent total knee surgery on the left and she says she was in bed on her motion machine.  She had some crampy uneasy feeling in her stomach and she got up to use the bedside commode.  She said she moved her bowels but felt more lightheaded and then she remembers being held by her daughter.  She does not think she fell to the floor.  She has had a little bit of nausea and vomiting and more loose stools since then.  She feels generally tired.  She is never had a syncopal event before.  She says she tends to run a little constipated and so her husband is giving her a lot of fiber to take since the surgery.  She denies any pain anywhere.  No shortness of breath no chest pain no headache no numbness or weakness.  The history is provided by the patient.  Loss of Consciousness   This is a new problem. The current episode started 1 to 2 hours ago. The problem has been resolved. She lost consciousness for a period of less than one minute. The problem is associated with bowel movements. Associated symptoms include abdominal pain, light-headedness, nausea and vomiting. Pertinent negatives include back pain, chest pain, fever, focal weakness, headaches and seizures. She has tried bed rest for the symptoms. The treatment provided significant relief.    Past Medical History:  Diagnosis Date  . Hypothyroidism   . Rheumatic fever    as a kid    Patient Active Problem List   Diagnosis Date Noted  . Primary osteoarthritis of left knee 12/10/2017    Past Surgical History:  Procedure Laterality Date  . ABDOMINAL HYSTERECTOMY    . APPENDECTOMY    . CHOLECYSTECTOMY    . TONSILLECTOMY      . TOTAL KNEE ARTHROPLASTY Left 12/10/2017   Procedure: TOTAL KNEE ARTHROPLASTY;  Surgeon: Dorna Leitz, MD;  Location: [facility] ORS;  Service: Orthopedics;  Laterality: Left;     OB History   None      Home Medications    Prior to Admission medications   Medication Sig Start Date End Date Taking? Authorizing Provider  aspirin EC 325 MG tablet Take 1 tablet (325 mg total) by mouth 2 (two) times daily after a meal. Take x 1 month post op to decrease risk of blood clots. 12/10/17   Gary Fleet, PA-C  CALCIUM ACETATE-MAGNESIUM CARB PO Take 1 tablet by mouth daily.    [provider]  co-enzyme Q-10 30 MG capsule Take 30 mg by mouth daily.    [provider]  docusate sodium (COLACE) 100 MG capsule Take 1 capsule (100 mg total) by mouth 2 (two) times daily. 12/10/17   Gary Fleet, PA-C  doxycycline (VIBRAMYCIN) 50 MG capsule Take 50 mg by mouth daily. 11/23/17   [provider]  famotidine-calcium carbonate-magnesium hydroxide (PEPCID COMPLETE) 10-800-165 MG chewable tablet Chew 3 tablets by mouth daily.    [provider]  Grape Seed Extract 50 MG CAPS Take 4 capsules by mouth daily.    [provider]  HYDROcodone-acetaminophen (NORCO) 5-325 MG tablet Take 1-2 tablets by  mouth every 6 (six) hours as needed for moderate pain. 12/10/17   Gary Fleet, PA-C  levothyroxine (SYNTHROID, LEVOTHROID) 75 MCG tablet Take 75 mcg by mouth daily before breakfast.  11/23/17   [provider]  Multiple Vitamin (MULTIVITAMIN WITH MINERALS) TABS tablet Take 1 tablet by mouth daily.    [provider]  Multiple Vitamins-Minerals (VISION FORMULA/LUTEIN) TABS Take 1 tablet by mouth daily.    [provider]  niacin 500 MG tablet Take 500 mg by mouth daily.    [provider]  Omega-3 Fatty Acids (FISH OIL) 1200 MG CPDR Take 1 capsule by mouth daily.    [provider]  tiZANidine (ZANAFLEX) 2 MG tablet Take 1 tablet (2 mg  total) by mouth every 8 (eight) hours as needed for muscle spasms. 12/10/17   Gary Fleet, PA-C  Zinc 15 MG CAPS Take 1 capsule by mouth daily.    [provider]    Family History History reviewed. No pertinent family history.  Social History Social History   Tobacco Use  . Smoking status: Never Smoker  . Smokeless tobacco: Never Used  Substance Use Topics  . Alcohol use: Never    Frequency: Never  . Drug use: Never     Allergies   Patient has no known allergies.   Review of Systems Review of Systems  Constitutional: Negative for fever.  HENT: Negative for sore throat.   Eyes: Negative for visual disturbance.  Respiratory: Negative for shortness of breath.   Cardiovascular: Positive for syncope. Negative for chest pain.  Gastrointestinal: Positive for abdominal pain, nausea and vomiting.  Genitourinary: Negative for dysuria.  Musculoskeletal: Negative for back pain.  Skin: Negative for rash.  Neurological: Positive for syncope and light-headedness. Negative for focal weakness, seizures and headaches.     Physical Exam Updated Vital Signs BP 108/61   Pulse 62   Temp (!) 97.4 F (36.3 C)   Resp 16   SpO2 100%   Physical Exam  Constitutional: She is oriented to person, place, and time. She appears well-developed and well-nourished. No distress.  HENT:  Head: Normocephalic and atraumatic.  Eyes: Conjunctivae are normal.  Neck: Neck supple.  Cardiovascular: Normal rate and regular rhythm.  No murmur heard. Pulmonary/Chest: Effort normal and breath sounds normal. No respiratory distress.  Abdominal: Soft. She exhibits no mass. There is no tenderness. There is no guarding.  Musculoskeletal: She exhibits edema. She exhibits no tenderness or deformity.  She has a bandage over her left knee which is more swollen than the right side.  There are no palpable cords.  Neurological: She is alert and oriented to person, place, and time. She has normal strength.  No cranial nerve deficit or sensory deficit. GCS eye subscore is 4. GCS verbal subscore is 5. GCS motor subscore is 6.  Skin: Skin is warm and dry.  Psychiatric: She has a normal mood and affect.  Nursing note and vitals reviewed.    ED Treatments / Results  Labs (all labs ordered are listed, but only abnormal results are displayed) Labs Reviewed  BASIC METABOLIC PANEL - Abnormal; Notable for the following components:      Result Value   Glucose, Bld 135 (*)    Calcium 8.8 (*)    All other components within normal limits  CBC WITH DIFFERENTIAL/PLATELET  TROPONIN I  URINALYSIS, ROUTINE W REFLEX MICROSCOPIC    EKG EKG Interpretation  Date/Time:  Thursday December 23 2017 08:42:49 EST Ventricular Rate:  63 PR Interval:  QRS Duration: 90 QT Interval:  431 QTC Calculation: 442 R Axis:   33 Text Interpretation:  Sinus rhythm similar to prior 11/19 Confirmed by Aletta Edouard [phone]) on 12/23/2017 8:47:31 AM Also confirmed by Aletta Edouard [phone]), editor Philomena Doheny [phone])  on 12/23/2017 9:11:38 AM   Radiology No results found.  Procedures Procedures (including critical care time)  Medications Ordered in ED Medications  sodium chloride 0.9 % bolus 1,000 mL (has no administration in time range)     Initial Impression / Assessment and Plan / ED Course  I have reviewed the triage vital signs and the nursing notes.  Pertinent labs & imaging results that were available during my care of the patient were reviewed by me and considered in my medical decision making (see chart for details).  Clinical Course as of Dec 24 1526  Thu Dec 23, 2017  1051 Patient here with a syncopal event during bowel movement.  Other than appearing tired she looks well and has no complaints.  Her lab work including EKG and troponin are unremarkable.  Will check orthostatics.   [MB]  9211 By [facility] syncope criteria she would be low risk.   [MB]  9417 Patient was able to stand up with  side of the bed and had no recurrence of her symptoms.  She is comfortable with discharge and will follow up with her doctors.  She understands to get more rest and stay well-hydrated.   [MB]    Clinical Course User Index [MB] Hayden Rasmussen, MD     Final Clinical Impressions(s) / ED Diagnoses   Final diagnoses:  Syncope and collapse    ED Discharge Orders    None       Hayden Rasmussen, MD 12/23/17 [phone]

## 2017-12-23 NOTE — ED Notes (Signed)
Bed: YZ70 Expected date: 12/23/17 Expected time: 8:04 AM Means of arrival: Ambulance Comments:

## 2017-12-23 NOTE — ED Notes (Signed)
Pt is alert and oriented x 4. Pt husband and dtr are at bedside. Pt family is requesting an update, MD aware and is in the room at this time.

## 2017-12-23 NOTE — Discharge Instructions (Addendum)
You were evaluated in the emergency department for a fainting spell while moving her bowels this morning.  You had blood work EKG that did not show an obvious cause of your symptoms.  It will be important for you to stay well-hydrated and get more rest.  Please follow-up with your doctors and return if any worsening symptoms.

## 2017-12-23 NOTE — ED Triage Notes (Signed)
EMS reports from home, witnessed syncopal episode, nausea vomiting and diarrhea since. No fall no visible injury. Pt A&O x 4 on arrival. Recent total replacement knee surgery 11/22  BP 110/56 HR 64 RR 20 Sp02 100 RA CBG 126

## 2018-06-28 ENCOUNTER — Other Ambulatory Visit: Payer: Self-pay | Admitting: Orthopedic Surgery

## 2018-07-08 ENCOUNTER — Encounter (HOSPITAL_COMMUNITY): Admission: RE | Admit: 2018-07-08 | Payer: Medicare HMO | Source: Ambulatory Visit

## 2018-08-09 ENCOUNTER — Other Ambulatory Visit (HOSPITAL_COMMUNITY)
Admission: RE | Admit: 2018-08-09 | Discharge: 2018-08-09 | Disposition: A | Discharge status: Home / Self Care | Payer: Medicare HMO | Source: Ambulatory Visit | Attending: Orthopedic Surgery | Admitting: Orthopedic Surgery

## 2018-08-09 DIAGNOSIS — Z1159 Encounter for screening for other viral diseases: Secondary | ICD-10-CM | POA: Diagnosis present

## 2018-08-09 LAB — SARS CORONAVIRUS 2 (TAT 6-24 HRS): SARS Coronavirus 2: NEGATIVE

## 2018-08-10 NOTE — Patient Instructions (Addendum)
YOU  HAD  A COVID 19 TEST ON 08-09-2018. ONCE YOUR COVID TEST IS COMPLETED, PLEASE BEGIN THE QUARANTINE INSTRUCTIONS AS OUTLINED IN YOUR HANDOUT.                [name]    Your procedure is scheduled on: 08-12-2018   Report to [facility] Main  Entrance    Report to [place] at 530 AM   Call this number if you have problems the morning of surgery [phone]    Remember: [place], NO [place].   NO SOLID FOOD AFTER MIDNIGHT THE NIGHT PRIOR TO SURGERY.    NOTHING BY MOUTH EXCEPT CLEAR LIQUIDS UNTIL 415 AM.    PLEASE FINISH ENSURE DRINK PER SURGEON ORDER 3 HOURS PRIOR TO SCHEDULED SURGERY TIME  WHICH NEEDS TO BE COMPLETED AT  415 AM.   CLEAR LIQUID DIET   Foods Allowed                                                                     Foods Excluded  Water, Black Coffee and tea, regular and decaf                             liquids that you cannot  Plain Jell-O any favor except red or purple                                           see through such as: Fruit ices (not with fruit pulp)                                     milk, soups, orange juice  Iced Popsicles                                    All solid food Carbonated beverages, regular and diet                                    Apple juices Sports drinks like Gatorade Lightly seasoned clear broth or consume(fat free) Sugar, honey syrup  Sample Menu Breakfast                                Lunch                                     Supper Cranberry juice                    Beef broth                            Chicken  broth Jell-O                                     Grape juice                           Apple juice Coffee or tea                        Jell-O                                      Popsicle                                                Coffee or tea                        Coffee or  tea  _____________________________________________________________________     Take these medicines the morning of surgery with A SIP OF WATER: Levothyroxine                               You may not have any metal on your body including hair pins and              piercings  Do not wear jewelry, make-up, lotions, powders or perfumes, deodorant             Do not wear nail polish.  Do not shave  48 hours prior to surgery.               Do not bring valuables to the hospital. [place].  Contacts, dentures or bridgework may not be worn into surgery.  Leave suitcase in the car. After surgery it may be brought to your room.   _____________________________________________________________________             [facility] - Preparing for Surgery Before surgery, you can play an important role.  Because skin is not sterile, your skin needs to be as free of germs as possible.  You can reduce the number of germs on your skin by washing with CHG (chlorahexidine gluconate) soap before surgery.  CHG is an antiseptic cleaner which kills germs and bonds with the skin to continue killing germs even after washing. Please DO NOT use if you have an allergy to CHG or antibacterial soaps.  If your skin becomes reddened/irritated stop using the CHG and inform your nurse when you arrive at Short Stay. Do not shave (including legs and underarms) for at least 48 hours prior to the first CHG shower.  You may shave your face/neck. Please follow these instructions carefully:  1.  Shower with CHG Soap the night before surgery and the  morning of Surgery.  2.  If you choose to wash your hair, wash your hair first as usual with your  normal  shampoo.  3.  After you shampoo, rinse your hair and body thoroughly to remove the  shampoo.  4.  Use CHG as you would any other liquid soap.  You can apply chg directly  to the skin and wash                        Gently with a scrungie or clean washcloth.  5.  Apply the CHG Soap to your body ONLY FROM THE NECK DOWN.   Do not use on face/ open                           Wound or open sores. Avoid contact with eyes, ears mouth and genitals (private parts).                       Wash face,  Genitals (private parts) with your normal soap.             6.  Wash thoroughly, paying special attention to the area where your surgery  will be performed.  7.  Thoroughly rinse your body with warm water from the neck down.  8.  DO NOT shower/wash with your normal soap after using and rinsing off  the CHG Soap.                9.  Pat yourself dry with a clean towel.            10.  Wear clean pajamas.            11.  Place clean sheets on your bed the night of your first shower and do not  sleep with pets. Day of Surgery : Do not apply any lotions/deodorants the morning of surgery.  Please wear clean clothes to the hospital/surgery center.  FAILURE TO FOLLOW THESE INSTRUCTIONS MAY RESULT IN THE CANCELLATION OF YOUR SURGERY PATIENT SIGNATURE_________________________________  NURSE SIGNATURE__________________________________  ________________________________________________________________________   Adam Phenix  An incentive spirometer is a tool that can help keep your lungs clear and active. This tool measures how well you are filling your lungs with each breath. Taking long deep breaths may help reverse or decrease the chance of developing breathing (pulmonary) problems (especially infection) following:  A long period of time when you are unable to move or be active. BEFORE THE PROCEDURE   If the spirometer includes an indicator to show your best effort, your nurse or respiratory therapist will set it to a desired goal.  If possible, sit up straight or lean slightly forward. Try not to slouch.  Hold the incentive spirometer in an upright position. INSTRUCTIONS FOR USE  1. Sit on the edge of  your bed if possible, or sit up as far as you can in bed or on a chair. 2. Hold the incentive spirometer in an upright position. 3. Breathe out normally. 4. Place the mouthpiece in your mouth and seal your lips tightly around it. 5. Breathe in slowly and as deeply as possible, raising the piston or the ball toward the top of the column. 6. Hold your breath for 3-5 seconds or for as long as possible. Allow the piston or ball to fall to the bottom of the column. 7. Remove the mouthpiece from your mouth and breathe out normally. 8. Rest for a few seconds and repeat Steps 1 through 7 at least 10 times every 1-2 hours when you are awake. Take your time and take a few normal breaths between deep breaths. 9. The spirometer may include an indicator to  show your best effort. Use the indicator as a goal to work toward during each repetition. 10. After each set of 10 deep breaths, practice coughing to be sure your lungs are clear. If you have an incision (the cut made at the time of surgery), support your incision when coughing by placing a pillow or rolled up towels firmly against it. Once you are able to get out of bed, walk around indoors and cough well. You may stop using the incentive spirometer when instructed by your caregiver.  RISKS AND COMPLICATIONS  Take your time so you do not get dizzy or light-headed.  If you are in pain, you may need to take or ask for pain medication before doing incentive spirometry. It is harder to take a deep breath if you are having pain. AFTER USE  Rest and breathe slowly and easily.  It can be helpful to keep track of a log of your progress. Your caregiver can provide you with a simple table to help with this. If you are using the spirometer at home, follow these instructions: [place] IF:   You are having difficultly using the spirometer.  You have trouble using the spirometer as often as instructed.  Your pain medication is not giving enough relief  while using the spirometer.  You develop fever of 100.5 F (38.1 C) or higher. SEEK IMMEDIATE MEDICAL CARE IF:   You cough up bloody sputum that had not been present before.  You develop fever of 102 F (38.9 C) or greater.  You develop worsening pain at or near the incision site. MAKE SURE YOU:   Understand these instructions.  Will watch your condition.  Will get help right away if you are not doing well or get worse. Document Released: 05/18/2006 Document Revised: 03/30/2011 Document Reviewed: 07/19/2006 ExitCare Patient Information 2014 ExitCare, [place].   ________________________________________________________________________  WHAT IS A BLOOD TRANSFUSION? Blood Transfusion Information  A transfusion is the replacement of blood or some of its parts. Blood is made up of multiple cells which provide different functions.  Red blood cells carry oxygen and are used for blood loss replacement.  White blood cells fight against infection.  Platelets control bleeding.  Plasma helps clot blood.  Other blood products are available for specialized needs, such as hemophilia or other clotting disorders. BEFORE THE TRANSFUSION  Who gives blood for transfusions?   Healthy volunteers who are fully evaluated to make sure their blood is safe. This is blood bank blood. Transfusion therapy is the safest it has ever been in the practice of medicine. Before blood is taken from a donor, a complete history is taken to make sure that person has no history of diseases nor engages in risky social behavior (examples are intravenous drug use or sexual activity with multiple partners). The donor's travel history is screened to minimize risk of transmitting infections, such as malaria. The donated blood is tested for signs of infectious diseases, such as HIV and hepatitis. The blood is then tested to be sure it is compatible with you in order to minimize the chance of a transfusion reaction. If you or  a relative donates blood, this is often done in anticipation of surgery and is not appropriate for emergency situations. It takes many days to process the donated blood. RISKS AND COMPLICATIONS Although transfusion therapy is very safe and saves many lives, the main dangers of transfusion include:   Getting an infectious disease.  Developing a transfusion reaction. This is an allergic reaction  to something in the blood you were given. Every precaution is taken to prevent this. The decision to have a blood transfusion has been considered carefully by your caregiver before blood is given. Blood is not given unless the benefits outweigh the risks. AFTER THE TRANSFUSION  Right after receiving a blood transfusion, you will usually feel much better and more energetic. This is especially true if your red blood cells have gotten low (anemic). The transfusion raises the level of the red blood cells which carry oxygen, and this usually causes an energy increase.  The nurse administering the transfusion will monitor you carefully for complications. HOME CARE INSTRUCTIONS  No special instructions are needed after a transfusion. You may find your energy is better. Speak with your caregiver about any limitations on activity for underlying diseases you may have. SEEK MEDICAL CARE IF:   Your condition is not improving after your transfusion.  You develop redness or irritation at the intravenous (IV) site. SEEK IMMEDIATE MEDICAL CARE IF:  Any of the following symptoms occur over the next 12 hours:  Shaking chills.  You have a temperature by mouth above 102 F (38.9 C), not controlled by medicine.  Chest, back, or muscle pain.  People around you feel you are not acting correctly or are confused.  Shortness of breath or difficulty breathing.  Dizziness and fainting.  You get a rash or develop hives.  You have a decrease in urine output.  Your urine turns a dark color or changes to pink, red, or  brown. Any of the following symptoms occur over the next 10 days:  You have a temperature by mouth above 102 F (38.9 C), not controlled by medicine.  Shortness of breath.  Weakness after normal activity.  The white part of the eye turns yellow (jaundice).  You have a decrease in the amount of urine or are urinating less often.  Your urine turns a dark color or changes to pink, red, or brown. Document Released: 01/03/2000 Document Revised: 03/30/2011 Document Reviewed: 08/22/2007 [facility] Patient Information 2014 [place], [place].  _______________________________________________________________________

## 2018-08-10 NOTE — Progress Notes (Signed)
EKG 12-23-17 Epic CHEST XRAY 12-10-17 EPIC

## 2018-08-11 ENCOUNTER — Encounter (HOSPITAL_COMMUNITY): Payer: Self-pay

## 2018-08-11 ENCOUNTER — Other Ambulatory Visit: Payer: Self-pay

## 2018-08-11 ENCOUNTER — Encounter (HOSPITAL_COMMUNITY)
Admission: RE | Admit: 2018-08-11 | Discharge: 2018-08-11 | Disposition: A | Discharge status: Home / Self Care | Payer: Medicare HMO | Source: Ambulatory Visit | Attending: Orthopedic Surgery | Admitting: Orthopedic Surgery

## 2018-08-11 DIAGNOSIS — Z96652 Presence of left artificial knee joint: Secondary | ICD-10-CM | POA: Diagnosis not present

## 2018-08-11 DIAGNOSIS — Z91018 Allergy to other foods: Secondary | ICD-10-CM | POA: Diagnosis not present

## 2018-08-11 DIAGNOSIS — M1711 Unilateral primary osteoarthritis, right knee: Secondary | ICD-10-CM | POA: Insufficient documentation

## 2018-08-11 DIAGNOSIS — Z79899 Other long term (current) drug therapy: Secondary | ICD-10-CM | POA: Diagnosis not present

## 2018-08-11 DIAGNOSIS — R110 Nausea: Secondary | ICD-10-CM | POA: Diagnosis not present

## 2018-08-11 DIAGNOSIS — Z01812 Encounter for preprocedural laboratory examination: Secondary | ICD-10-CM | POA: Insufficient documentation

## 2018-08-11 DIAGNOSIS — K219 Gastro-esophageal reflux disease without esophagitis: Secondary | ICD-10-CM | POA: Diagnosis not present

## 2018-08-11 DIAGNOSIS — F0390 Unspecified dementia without behavioral disturbance: Secondary | ICD-10-CM | POA: Diagnosis not present

## 2018-08-11 DIAGNOSIS — Z85820 Personal history of malignant melanoma of skin: Secondary | ICD-10-CM | POA: Diagnosis not present

## 2018-08-11 DIAGNOSIS — Z79890 Hormone replacement therapy (postmenopausal): Secondary | ICD-10-CM | POA: Diagnosis not present

## 2018-08-11 DIAGNOSIS — E039 Hypothyroidism, unspecified: Secondary | ICD-10-CM | POA: Diagnosis not present

## 2018-08-11 HISTORY — DX: Gastro-esophageal reflux disease without esophagitis: K21.9

## 2018-08-11 HISTORY — DX: Personal history of other diseases of the digestive system: Z87.19

## 2018-08-11 HISTORY — DX: Unspecified osteoarthritis, unspecified site: M19.90

## 2018-08-11 HISTORY — DX: Malignant melanoma of skin, unspecified: C43.9

## 2018-08-11 LAB — CBC WITH DIFFERENTIAL/PLATELET
Abs Immature Granulocytes: 0.01 10*3/uL (ref 0.00–0.07)
Basophils Absolute: 0 10*3/uL (ref 0.0–0.1)
Basophils Relative: 1 %
Eosinophils Absolute: 0.1 10*3/uL (ref 0.0–0.5)
Eosinophils Relative: 1 %
HCT: 43.7 % (ref 36.0–46.0)
Hemoglobin: 13.2 g/dL (ref 12.0–15.0)
Immature Granulocytes: 0 %
Lymphocytes Relative: 26 %
Lymphs Abs: 1.1 10*3/uL (ref 0.7–4.0)
MCH: 29.1 pg (ref 26.0–34.0)
MCHC: 30.2 g/dL (ref 30.0–36.0)
MCV: 96.5 fL (ref 80.0–100.0)
Monocytes Absolute: 0.4 10*3/uL (ref 0.1–1.0)
Monocytes Relative: 10 %
Neutro Abs: 2.7 10*3/uL (ref 1.7–7.7)
Neutrophils Relative %: 62 %
Platelets: 167 10*3/uL (ref 150–400)
RBC: 4.53 MIL/uL (ref 3.87–5.11)
RDW: 13.9 % (ref 11.5–15.5)
WBC: 4.3 10*3/uL (ref 4.0–10.5)
nRBC: 0 % (ref 0.0–0.2)

## 2018-08-11 LAB — COMPREHENSIVE METABOLIC PANEL
ALT: 17 U/L (ref 0–44)
AST: 25 U/L (ref 15–41)
Albumin: 4.1 g/dL (ref 3.5–5.0)
Alkaline Phosphatase: 40 U/L (ref 38–126)
Anion gap: 11 (ref 5–15)
BUN: 20 mg/dL (ref 8–23)
CO2: 25 mmol/L (ref 22–32)
Calcium: 9.3 mg/dL (ref 8.9–10.3)
Chloride: 103 mmol/L (ref 98–111)
Creatinine, Ser: 0.73 mg/dL (ref 0.44–1.00)
GFR calc Af Amer: >60 mL/min (ref >60)
GFR calc non Af Amer: >60 mL/min (ref >60)
Glucose, Bld: 96 mg/dL (ref 70–99)
Potassium: 4.4 mmol/L (ref 3.5–5.1)
Sodium: 139 mmol/L (ref 135–145)
Total Bilirubin: 0.7 mg/dL (ref 0.3–1.2)
Total Protein: 6.2 g/dL — ABNORMAL LOW (ref 6.5–8.1)

## 2018-08-11 LAB — URINALYSIS, ROUTINE W REFLEX MICROSCOPIC
Bilirubin Urine: NEGATIVE
Glucose, UA: NEGATIVE mg/dL
Hgb urine dipstick: NEGATIVE
Ketones, ur: NEGATIVE mg/dL
Nitrite: NEGATIVE
Protein, ur: NEGATIVE mg/dL
Specific Gravity, Urine: 1.008 (ref 1.005–1.030)
pH: 7 (ref 5.0–8.0)

## 2018-08-11 LAB — PROTIME-INR
INR: 1 (ref 0.8–1.2)
Prothrombin Time: 13.4 seconds (ref 11.4–15.2)

## 2018-08-11 LAB — SURGICAL PCR SCREEN
MRSA, PCR: NEGATIVE
Staphylococcus aureus: NEGATIVE

## 2018-08-11 LAB — APTT: aPTT: 29 seconds (ref 24–36)

## 2018-08-11 MED ORDER — BUPIVACAINE LIPOSOME 1.3 % IJ SUSP
20.0000 mL | Freq: Once | INTRAMUSCULAR | Status: DC
  Filled 2018-08-11: qty 20

## 2018-08-11 NOTE — H&P (Signed)
TOTAL KNEE ADMISSION H&P  Patient is being admitted for right total knee arthroplasty.  Subjective:  Chief Complaint:right knee pain.  HPI: [name], 78 y.o. female, has a history of pain and functional disability in the right knee due to arthritis and has failed non-surgical conservative treatments for greater than 12 weeks to includeNSAID's and/or analgesics, corticosteriod injections, viscosupplementation injections and activity modification.  Onset of symptoms was gradual, starting 8 years ago with gradually worsening course since that time. The patient noted no past surgery on the right knee(s).  Patient currently rates pain in the right knee(s) at 9 out of 10 with activity. Patient has night pain, worsening of pain with activity and weight bearing, pain that interferes with activities of daily living, pain with passive range of motion and joint swelling.  Patient has evidence of periarticular osteophytes and joint space narrowing by imaging studies. This patient has had failure of all reasonable conservative care. There is no active infection.  Patient Active Problem List   Diagnosis Date Noted  . Primary osteoarthritis of left knee 12/10/2017   Past Medical History:  Diagnosis Date  . Arthritis   . GERD (gastroesophageal reflux disease)   . History of appendicitis   . Hypothyroidism   . Melanoma ([place])    Right shoulder  . Rheumatic fever    as a kid    Past Surgical History:  Procedure Laterality Date  . ABDOMINAL HYSTERECTOMY    . APPENDECTOMY    . CHOLECYSTECTOMY    . COLONOSCOPY    . TONSILLECTOMY    . TOTAL KNEE ARTHROPLASTY Left 12/10/2017   Procedure: TOTAL KNEE ARTHROPLASTY;  Surgeon: Dorna Leitz, MD;  Location: [facility];  Service: Orthopedics;  Laterality: Left;    Current Facility-Administered Medications  Medication Dose Route Frequency Provider Last Rate Last Dose  . [START ON 08/12/2018] bupivacaine liposome (EXPAREL) 1.3 % injection 266 mg  20 mL Other  Once Dorna Leitz, MD       Current Outpatient Medications  Medication Sig Dispense Refill Last Dose  . acetaminophen (TYLENOL) 500 MG tablet Take 1,000 mg by mouth every 8 (eight) hours as needed for mild pain or headache.     . alendronate (FOSAMAX) 70 MG tablet Take 70 mg by mouth every Saturday.     Marland Kitchen CALCIUM ACETATE-MAGNESIUM CARB PO Take 1 tablet by mouth daily.     Marland Kitchen co-enzyme Q-10 30 MG capsule Take 30 mg by mouth daily.     . Grape Seed Extract 50 MG CAPS Take 100 mg by mouth daily.      Marland Kitchen levothyroxine (SYNTHROID) 100 MCG tablet Take 100 mcg by mouth every other day. Alternate taking 134mcg one day and 82mcg the next.     Marland Kitchen levothyroxine (SYNTHROID) 88 MCG tablet Take 88 mcg by mouth every other day. Alternate taking 135mcg one day and 18mcg the next.     . Multiple Vitamin (MULTIVITAMIN WITH MINERALS) TABS tablet Take 1 tablet by mouth daily.     . niacin 500 MG tablet Take 500 mg by mouth daily.     . Omega-3 Fatty Acids (FISH OIL) 1000 MG CAPS Take 1,000 mg by mouth daily.     Marland Kitchen OVER THE COUNTER MEDICATION Take 1 tablet by mouth 3 (three) times daily with meals. Pepcid with Hydrochloric Acid 600 mg      . Probiotic Product (PROBIOTIC PO) Take 1 capsule by mouth daily.     Marland Kitchen zinc sulfate 220 (50 Zn) MG capsule Take  220 mg by mouth daily.      Allergies  Allergen Reactions  . Other     Garlic, milk, soy beans, baby lima beans and grapes cause headaches    Social History   Tobacco Use  . Smoking status: Never Smoker  . Smokeless tobacco: Never Used  Substance Use Topics  . Alcohol use: Never    Frequency: Never    No family history on file.   ROS ROS: I have reviewed the patient's review of systems thoroughly and there are no positive responses as relates to the HPI. Objective:  Physical Exam  Vital signs in last 24 hours: Temp:  [97.9 F (36.6 C)] 97.9 F (36.6 C) (07/23 1125) Pulse Rate:  [75] 75 (07/23 1125) Resp:  [16] 16 (07/23 1125) BP: (146)/(59) 146/59  (07/23 1125) SpO2:  [100 %] 100 % (07/23 1125) Weight:  [85.8 kg] 85.8 kg (07/23 1125) Well-developed well-nourished patient in no acute distress. Alert and oriented x3 HEENT:within normal limits Cardiac: Regular rate and rhythm Pulmonary: Lungs clear to auscultation Abdomen: Soft and nontender.  Normal active bowel sounds  Musculoskeletal: (Right knee: Painful range of motion.  Limited range of motion.  No instability.  Trace effusion.  Neurovascular intact distally. Labs: Recent Results (from the past 2160 hour(s))  SARS Coronavirus 2 (Performed in [place] hospital lab)     Status: None   Collection Time: 08/09/18 12:12 PM   Specimen: Nasal Swab  Result Value Ref Range   SARS Coronavirus 2 NEGATIVE NEGATIVE    Comment: (NOTE) SARS-CoV-2 target nucleic acids are NOT DETECTED. The SARS-CoV-2 RNA is generally detectable in upper and lower respiratory specimens during the acute phase of infection. Negative results do not preclude SARS-CoV-2 infection, do not rule out co-infections with other pathogens, and should not be used as the sole basis for treatment or other patient management decisions. Negative results must be combined with clinical observations, patient history, and epidemiological information. The expected result is Negative. Fact Sheet for Patients: SugarRoll.be Fact Sheet for Healthcare Providers: https://www.woods-mathews.com/ This test is not yet approved or cleared by the [place] FDA and  has been authorized for detection and/or diagnosis of SARS-CoV-2 by FDA under an Emergency Use Authorization (EUA). This EUA will remain  in effect (meaning this test can be used) for the duration of the COVID-19 declaration under Section 56 4(b)(1) of the Act, 21 U.S.C. section 360bbb-3(b)(1), unless the authorization is terminated or revoked sooner. Performed at [facility], [place] [address] [place], [place] [address]    APTT     Status: None   Collection Time: 08/11/18 11:58 AM  Result Value Ref Range   aPTT 29 24 - 36 seconds    Comment: Performed at [facility], [place] [address] [place], [place] [address]  CBC WITH DIFFERENTIAL     Status: None   Collection Time: 08/11/18 11:58 AM  Result Value Ref Range   WBC 4.3 4.0 - 10.5 K/uL   RBC 4.53 3.87 - 5.11 MIL/uL   Hemoglobin 13.2 12.0 - 15.0 g/dL   HCT 43.7 36.0 - 46.0 %   MCV 96.5 80.0 - 100.0 fL   MCH 29.1 26.0 - 34.0 pg   MCHC 30.2 30.0 - 36.0 g/dL   RDW 13.9 11.5 - 15.5 %   Platelets 167 150 - 400 K/uL   nRBC 0.0 0.0 - 0.2 %   Neutrophils Relative % 62 %   Neutro Abs 2.7 1.7 - 7.7 K/uL  Lymphocytes Relative 26 %   Lymphs Abs 1.1 0.7 - 4.0 K/uL   Monocytes Relative 10 %   Monocytes Absolute 0.4 0.1 - 1.0 K/uL   Eosinophils Relative 1 %   Eosinophils Absolute 0.1 0.0 - 0.5 K/uL   Basophils Relative 1 %   Basophils Absolute 0.0 0.0 - 0.1 K/uL   Immature Granulocytes 0 %   Abs Immature Granulocytes 0.01 0.00 - 0.07 K/uL    Comment: Performed at [facility], [place] [address], [place], [place] [address]  Comprehensive metabolic panel     Status: Abnormal   Collection Time: 08/11/18 11:58 AM  Result Value Ref Range   Sodium 139 135 - 145 mmol/L   Potassium 4.4 3.5 - 5.1 mmol/L   Chloride 103 98 - 111 mmol/L   CO2 25 22 - 32 mmol/L   Glucose, Bld 96 70 - 99 mg/dL   BUN 20 8 - 23 mg/dL   Creatinine, Ser 0.73 0.44 - 1.00 mg/dL   Calcium 9.3 8.9 - 10.3 mg/dL   Total Protein 6.2 (L) 6.5 - 8.1 g/dL   Albumin 4.1 3.5 - 5.0 g/dL   AST 25 15 - 41 U/L   ALT 17 0 - 44 U/L   Alkaline Phosphatase 40 38 - 126 U/L   Total Bilirubin 0.7 0.3 - 1.2 mg/dL   GFR calc non Af Amer >60 >60 mL/min   GFR calc Af Amer >60 >60 mL/min   Anion gap 11 5 - 15    Comment: Performed at [facility], [place] [address], [place], [place] [address]  Protime-INR     Status: None   Collection Time: 08/11/18 11:58 AM   Result Value Ref Range   Prothrombin Time 13.4 11.4 - 15.2 seconds   INR 1.0 0.8 - 1.2    Comment: (NOTE) INR goal varies based on device and disease states. Performed at [facility] [place] [address], [place], [place] [address]   Type and screen Order type and screen if day of surgery is less than 15 days from draw of preadmission visit or order morning of surgery if day of surgery is greater than 6 days from preadmission visit.     Status: None   Collection Time: 08/11/18 11:58 AM  Result Value Ref Range   ABO/RH(D) A POS    Antibody Screen NEG    Sample Expiration 08/25/2018,2359    Extend sample reason      NO TRANSFUSIONS OR PREGNANCY IN THE PAST 3 MONTHS Performed at [facility], [place] [address] [place], [place] [address]   Urinalysis, Routine w reflex microscopic     Status: Abnormal   Collection Time: 08/11/18 11:58 AM  Result Value Ref Range   Color, Urine STRAW (A) YELLOW   APPearance CLEAR CLEAR   Specific Gravity, Urine 1.008 1.005 - 1.030   pH 7.0 5.0 - 8.0   Glucose, UA NEGATIVE NEGATIVE mg/dL   Hgb urine dipstick NEGATIVE NEGATIVE   Bilirubin Urine NEGATIVE NEGATIVE   Ketones, ur NEGATIVE NEGATIVE mg/dL   Protein, ur NEGATIVE NEGATIVE mg/dL   Nitrite NEGATIVE NEGATIVE   Leukocytes,Ua SMALL (A) NEGATIVE   RBC / HPF 0-5 0 - 5 RBC/hpf   WBC, UA 0-5 0 - 5 WBC/hpf   Bacteria, UA RARE (A) NONE SEEN   Squamous Epithelial / LPF 0-5 0 - 5   Mucus PRESENT     Comment: Performed at [facility], [place]  [address] [place], [place] [address]  Surgical pcr screen     Status: None   Collection Time: 08/11/18 11:58 AM   Specimen: Nasal Swab  Result Value Ref Range   MRSA, PCR NEGATIVE NEGATIVE   Staphylococcus aureus NEGATIVE NEGATIVE    Comment: (NOTE) The Xpert SA Assay (FDA approved for NASAL specimens in patients 72 years of age and older), is one component of a comprehensive surveillance program. It is not  intended to diagnose infection nor to guide or monitor treatment. Performed at [facility], [place] [address] [place], [place] [address]     Estimated body mass index is 30.52 kg/m as calculated from the following:   Height as of 08/11/18: 5\' 6"  (1.676 m).   Weight as of 08/11/18: 85.8 kg.   Imaging Review Plain radiographs demonstrate severe degenerative joint disease of the right knee(s). The overall alignment ismild varus. The bone quality appears to be fair for age and reported activity level.      Assessment/Plan:  End stage arthritis, right knee   The patient history, physical examination, clinical judgment of the provider and imaging studies are consistent with end stage degenerative joint disease of the right knee(s) and total knee arthroplasty is deemed medically necessary. The treatment options including medical management, injection therapy arthroscopy and arthroplasty were discussed at length. The risks and benefits of total knee arthroplasty were presented and reviewed. The risks due to aseptic loosening, infection, stiffness, patella tracking problems, thromboembolic complications and other imponderables were discussed. The patient acknowledged the explanation, agreed to proceed with the plan and consent was signed. Patient is being admitted for inpatient treatment for surgery, pain control, PT, OT, prophylactic antibiotics, VTE prophylaxis, progressive ambulation and ADL's and discharge planning. The patient is planning to be discharged home with home health services     Patient's anticipated LOS is less than 2 midnights, meeting these requirements: - Younger than 65 - Lives within 1 hour of care - Has a competent adult at home to recover with post-op recover - NO history of  - Chronic pain requiring opiods  - Diabetes  - Coronary Artery Disease  - Heart failure  - Heart attack  - Stroke  - DVT/VTE  - Cardiac arrhythmia  - Respiratory  Failure/COPD  - Renal failure  - Anemia  - Advanced Liver disease

## 2018-08-11 NOTE — Progress Notes (Signed)
SPOKE W/  [place]     SCREENING SYMPTOMS OF COVID 19:   COUGH--NO  RUNNY NOSE--- NO  SORE THROAT---NO  NASAL CONGESTION----NO  SNEEZING----NO  SHORTNESS OF BREATH---NO  DIFFICULTY BREATHING---NO  TEMP >100.0 -----NO  UNEXPLAINED BODY ACHES------NO  CHILLS -------- NO  HEADACHES ---------NO  LOSS OF SMELL/ TASTE --------NO    HAVE YOU OR ANY FAMILY MEMBER TRAVELLED PAST 14 DAYS OUT OF THE   COUNTY---NO STATE----NO COUNTRY----NO  HAVE YOU OR ANY FAMILY MEMBER BEEN EXPOSED TO ANYONE WITH COVID 19? NO

## 2018-08-12 ENCOUNTER — Other Ambulatory Visit: Payer: Self-pay

## 2018-08-12 ENCOUNTER — Ambulatory Visit (HOSPITAL_COMMUNITY): Payer: Medicare HMO | Admitting: Physician Assistant

## 2018-08-12 ENCOUNTER — Ambulatory Visit (HOSPITAL_COMMUNITY)
Admission: RE | Admit: 2018-08-12 | Discharge: 2018-08-13 | Disposition: A | Discharge status: Home Health | Payer: Medicare HMO | Attending: Orthopedic Surgery | Admitting: Orthopedic Surgery

## 2018-08-12 ENCOUNTER — Encounter (HOSPITAL_COMMUNITY)
Admission: RE | Disposition: A | Discharge status: Home Health | Payer: Self-pay | Source: Home / Self Care | Attending: Orthopedic Surgery

## 2018-08-12 ENCOUNTER — Encounter (HOSPITAL_COMMUNITY): Payer: Self-pay | Admitting: *Deleted

## 2018-08-12 ENCOUNTER — Ambulatory Visit (HOSPITAL_COMMUNITY): Payer: Medicare HMO | Admitting: Certified Registered Nurse Anesthetist

## 2018-08-12 DIAGNOSIS — E039 Hypothyroidism, unspecified: Secondary | ICD-10-CM | POA: Insufficient documentation

## 2018-08-12 DIAGNOSIS — Z79890 Hormone replacement therapy (postmenopausal): Secondary | ICD-10-CM | POA: Insufficient documentation

## 2018-08-12 DIAGNOSIS — R110 Nausea: Secondary | ICD-10-CM | POA: Insufficient documentation

## 2018-08-12 DIAGNOSIS — M1711 Unilateral primary osteoarthritis, right knee: Secondary | ICD-10-CM | POA: Diagnosis present

## 2018-08-12 DIAGNOSIS — Z91018 Allergy to other foods: Secondary | ICD-10-CM | POA: Insufficient documentation

## 2018-08-12 DIAGNOSIS — K219 Gastro-esophageal reflux disease without esophagitis: Secondary | ICD-10-CM | POA: Insufficient documentation

## 2018-08-12 DIAGNOSIS — Z79899 Other long term (current) drug therapy: Secondary | ICD-10-CM | POA: Insufficient documentation

## 2018-08-12 DIAGNOSIS — Z96652 Presence of left artificial knee joint: Secondary | ICD-10-CM | POA: Insufficient documentation

## 2018-08-12 DIAGNOSIS — Z85820 Personal history of malignant melanoma of skin: Secondary | ICD-10-CM | POA: Insufficient documentation

## 2018-08-12 DIAGNOSIS — F0390 Unspecified dementia without behavioral disturbance: Secondary | ICD-10-CM | POA: Insufficient documentation

## 2018-08-12 HISTORY — PX: TOTAL KNEE ARTHROPLASTY: SHX125

## 2018-08-12 LAB — TYPE AND SCREEN
ABO/RH(D): A POS
Antibody Screen: NEGATIVE

## 2018-08-12 SURGERY — ARTHROPLASTY, KNEE, TOTAL
Anesthesia: Spinal | Site: Knee | Laterality: Right

## 2018-08-12 MED ORDER — FENTANYL CITRATE (PF) 100 MCG/2ML IJ SOLN
INTRAMUSCULAR | Status: AC
  Filled 2018-08-12: qty 2

## 2018-08-12 MED ORDER — SODIUM CHLORIDE 0.9 % IV SOLN
INTRAVENOUS | Status: DC
  Administered 2018-08-12: 21:00:00 via INTRAVENOUS
  Administered 2018-08-12: 100 mL/h via INTRAVENOUS

## 2018-08-12 MED ORDER — CHLORHEXIDINE GLUCONATE 4 % EX LIQD: 60.0000 mL | Freq: Once | CUTANEOUS | Status: DC

## 2018-08-12 MED ORDER — ONDANSETRON HCL 4 MG/2ML IJ SOLN
INTRAMUSCULAR | Status: AC
  Filled 2018-08-12: qty 2

## 2018-08-12 MED ORDER — CEFAZOLIN SODIUM-DEXTROSE 2-4 GM/100ML-% IV SOLN
2.0000 g | INTRAVENOUS | Status: AC
  Administered 2018-08-12: 2 g via INTRAVENOUS
  Filled 2018-08-12: qty 100

## 2018-08-12 MED ORDER — PROPOFOL 10 MG/ML IV BOLUS
INTRAVENOUS | Status: AC
  Filled 2018-08-12: qty 60

## 2018-08-12 MED ORDER — ASPIRIN EC 325 MG PO TBEC: 325.0000 mg | DELAYED_RELEASE_TABLET | Freq: Two times a day (BID) | ORAL | 0 refills | Status: AC

## 2018-08-12 MED ORDER — PROPOFOL 500 MG/50ML IV EMUL
INTRAVENOUS | Status: DC | PRN
  Administered 2018-08-12: 75 ug/kg/min via INTRAVENOUS

## 2018-08-12 MED ORDER — CELECOXIB 200 MG PO CAPS
200.0000 mg | ORAL_CAPSULE | Freq: Two times a day (BID) | ORAL | Status: DC
  Administered 2018-08-12 – 2018-08-13 (×3): 200 mg via ORAL
  Filled 2018-08-12 (×3): qty 1

## 2018-08-12 MED ORDER — TRANEXAMIC ACID-NACL 1000-0.7 MG/100ML-% IV SOLN
1000.0000 mg | Freq: Once | INTRAVENOUS | Status: AC
  Administered 2018-08-12: 1000 mg via INTRAVENOUS
  Filled 2018-08-12: qty 100

## 2018-08-12 MED ORDER — BUPIVACAINE LIPOSOME 1.3 % IJ SUSP
INTRAMUSCULAR | Status: DC | PRN
  Administered 2018-08-12: 20 mL

## 2018-08-12 MED ORDER — MORPHINE SULFATE (PF) 4 MG/ML IV SOLN: 0.5000 mg | INTRAVENOUS | Status: DC | PRN

## 2018-08-12 MED ORDER — POLYETHYLENE GLYCOL 3350 17 G PO PACK: 17.0000 g | PACK | Freq: Every day | ORAL | Status: DC | PRN

## 2018-08-12 MED ORDER — HYDROCODONE-ACETAMINOPHEN 5-325 MG PO TABS
1.0000 | ORAL_TABLET | ORAL | Status: DC | PRN
  Administered 2018-08-12 – 2018-08-13 (×5): 2 via ORAL
  Filled 2018-08-12 (×5): qty 2

## 2018-08-12 MED ORDER — LEVOTHYROXINE SODIUM 100 MCG PO TABS
100.0000 ug | ORAL_TABLET | ORAL | Status: DC
  Administered 2018-08-13: 100 ug via ORAL
  Filled 2018-08-12: qty 1

## 2018-08-12 MED ORDER — ONDANSETRON HCL 4 MG/2ML IJ SOLN
4.0000 mg | Freq: Four times a day (QID) | INTRAMUSCULAR | Status: DC | PRN
  Administered 2018-08-13: 09:00:00 4 mg via INTRAVENOUS
  Filled 2018-08-12: qty 2

## 2018-08-12 MED ORDER — WATER FOR IRRIGATION, STERILE IR SOLN
Status: DC | PRN
  Administered 2018-08-12: 1000 mL

## 2018-08-12 MED ORDER — DOCUSATE SODIUM 100 MG PO CAPS: 100.0000 mg | ORAL_CAPSULE | Freq: Two times a day (BID) | ORAL | 0 refills | Status: AC

## 2018-08-12 MED ORDER — SODIUM CHLORIDE (PF) 0.9 % IJ SOLN
INTRAMUSCULAR | Status: AC
  Filled 2018-08-12: qty 50

## 2018-08-12 MED ORDER — TIZANIDINE HCL 2 MG PO TABS: 2.0000 mg | ORAL_TABLET | Freq: Three times a day (TID) | ORAL | 0 refills | Status: AC | PRN

## 2018-08-12 MED ORDER — SODIUM CHLORIDE 0.9% FLUSH
INTRAVENOUS | Status: DC | PRN
  Administered 2018-08-12: 50 mL

## 2018-08-12 MED ORDER — LACTATED RINGERS IV SOLN
INTRAVENOUS | Status: DC
  Administered 2018-08-12 (×2): via INTRAVENOUS

## 2018-08-12 MED ORDER — ONDANSETRON HCL 4 MG PO TABS: 4.0000 mg | ORAL_TABLET | Freq: Four times a day (QID) | ORAL | Status: DC | PRN

## 2018-08-12 MED ORDER — ONDANSETRON HCL 4 MG/2ML IJ SOLN
INTRAMUSCULAR | Status: DC | PRN
  Administered 2018-08-12: 4 mg via INTRAVENOUS

## 2018-08-12 MED ORDER — BUPIVACAINE IN DEXTROSE 0.75-8.25 % IT SOLN
INTRATHECAL | Status: DC | PRN
  Administered 2018-08-12: 1.6 mL via INTRATHECAL

## 2018-08-12 MED ORDER — TRANEXAMIC ACID-NACL 1000-0.7 MG/100ML-% IV SOLN
1000.0000 mg | INTRAVENOUS | Status: AC
  Administered 2018-08-12: 1000 mg via INTRAVENOUS
  Filled 2018-08-12: qty 100

## 2018-08-12 MED ORDER — ALUM & MAG HYDROXIDE-SIMETH 200-200-20 MG/5ML PO SUSP: 30.0000 mL | ORAL | Status: DC | PRN

## 2018-08-12 MED ORDER — 0.9 % SODIUM CHLORIDE (POUR BTL) OPTIME
TOPICAL | Status: DC | PRN
  Administered 2018-08-12: 1000 mL

## 2018-08-12 MED ORDER — METHOCARBAMOL 500 MG PO TABS: 500.0000 mg | ORAL_TABLET | Freq: Four times a day (QID) | ORAL | Status: DC | PRN

## 2018-08-12 MED ORDER — METOCLOPRAMIDE HCL 5 MG/ML IJ SOLN: 10.0000 mg | Freq: Once | INTRAMUSCULAR | Status: DC | PRN

## 2018-08-12 MED ORDER — FENTANYL CITRATE (PF) 100 MCG/2ML IJ SOLN: 25.0000 ug | INTRAMUSCULAR | Status: DC | PRN

## 2018-08-12 MED ORDER — BISACODYL 5 MG PO TBEC: 5.0000 mg | DELAYED_RELEASE_TABLET | Freq: Every day | ORAL | Status: DC | PRN

## 2018-08-12 MED ORDER — FENTANYL CITRATE (PF) 100 MCG/2ML IJ SOLN
INTRAMUSCULAR | Status: DC | PRN
  Administered 2018-08-12: 50 ug via INTRAVENOUS

## 2018-08-12 MED ORDER — EPHEDRINE 5 MG/ML INJ
INTRAVENOUS | Status: AC
  Filled 2018-08-12: qty 10

## 2018-08-12 MED ORDER — ROPIVACAINE HCL 5 MG/ML IJ SOLN
INTRAMUSCULAR | Status: DC | PRN
  Administered 2018-08-12: 30 mL via PERINEURAL

## 2018-08-12 MED ORDER — ASPIRIN EC 325 MG PO TBEC
325.0000 mg | DELAYED_RELEASE_TABLET | Freq: Two times a day (BID) | ORAL | Status: DC
  Administered 2018-08-12 – 2018-08-13 (×2): 325 mg via ORAL
  Filled 2018-08-12 (×2): qty 1

## 2018-08-12 MED ORDER — BUPIVACAINE-EPINEPHRINE 0.5% -1:200000 IJ SOLN
INTRAMUSCULAR | Status: DC | PRN
  Administered 2018-08-12: 30 mL

## 2018-08-12 MED ORDER — ACETAMINOPHEN 325 MG PO TABS: 325.0000 mg | ORAL_TABLET | Freq: Four times a day (QID) | ORAL | Status: DC | PRN

## 2018-08-12 MED ORDER — LIDOCAINE 2% (20 MG/ML) 5 ML SYRINGE
INTRAMUSCULAR | Status: AC
  Filled 2018-08-12: qty 5

## 2018-08-12 MED ORDER — PROPOFOL 10 MG/ML IV BOLUS
INTRAVENOUS | Status: DC | PRN
  Administered 2018-08-12: 20 mg via INTRAVENOUS

## 2018-08-12 MED ORDER — EPHEDRINE SULFATE-NACL 50-0.9 MG/10ML-% IV SOSY
PREFILLED_SYRINGE | INTRAVENOUS | Status: DC | PRN
  Administered 2018-08-12 (×3): 10 mg via INTRAVENOUS
  Administered 2018-08-12: 5 mg via INTRAVENOUS
  Administered 2018-08-12: 10 mg via INTRAVENOUS

## 2018-08-12 MED ORDER — POVIDONE-IODINE 10 % EX SWAB
2.0000 "application " | Freq: Once | CUTANEOUS | Status: AC
  Administered 2018-08-12: 2 via TOPICAL

## 2018-08-12 MED ORDER — HYDROCODONE-ACETAMINOPHEN 5-325 MG PO TABS: 1.0000 | ORAL_TABLET | Freq: Four times a day (QID) | ORAL | 0 refills | Status: DC | PRN

## 2018-08-12 MED ORDER — MEPERIDINE HCL 50 MG/ML IJ SOLN: 6.2500 mg | INTRAMUSCULAR | Status: DC | PRN

## 2018-08-12 MED ORDER — DOCUSATE SODIUM 100 MG PO CAPS
100.0000 mg | ORAL_CAPSULE | Freq: Two times a day (BID) | ORAL | Status: DC
  Administered 2018-08-12 – 2018-08-13 (×2): 100 mg via ORAL
  Filled 2018-08-12 (×2): qty 1

## 2018-08-12 MED ORDER — DIPHENHYDRAMINE HCL 12.5 MG/5ML PO ELIX: 12.5000 mg | ORAL_SOLUTION | ORAL | Status: DC | PRN

## 2018-08-12 MED ORDER — CEFAZOLIN SODIUM-DEXTROSE 2-4 GM/100ML-% IV SOLN
2.0000 g | Freq: Four times a day (QID) | INTRAVENOUS | Status: AC
  Administered 2018-08-12 (×2): 2 g via INTRAVENOUS
  Filled 2018-08-12 (×2): qty 100

## 2018-08-12 MED ORDER — METHOCARBAMOL 500 MG IVPB - SIMPLE MED
INTRAVENOUS | Status: AC
  Filled 2018-08-12: qty 50

## 2018-08-12 MED ORDER — DEXAMETHASONE SODIUM PHOSPHATE 10 MG/ML IJ SOLN
10.0000 mg | Freq: Two times a day (BID) | INTRAMUSCULAR | Status: DC
  Administered 2018-08-13: 10 mg via INTRAVENOUS
  Filled 2018-08-12: qty 1

## 2018-08-12 MED ORDER — SODIUM CHLORIDE 0.9 % IR SOLN
Status: DC | PRN
  Administered 2018-08-12: 1000 mL

## 2018-08-12 MED ORDER — MAGNESIUM CITRATE PO SOLN: 1.0000 | Freq: Once | ORAL | Status: DC | PRN

## 2018-08-12 MED ORDER — LEVOTHYROXINE SODIUM 88 MCG PO TABS: 88.0000 ug | ORAL_TABLET | ORAL | Status: DC

## 2018-08-12 MED ORDER — METHOCARBAMOL 500 MG IVPB - SIMPLE MED
500.0000 mg | Freq: Four times a day (QID) | INTRAVENOUS | Status: DC | PRN
  Administered 2018-08-12: 500 mg via INTRAVENOUS
  Filled 2018-08-12: qty 50

## 2018-08-12 MED ORDER — BUPIVACAINE-EPINEPHRINE (PF) 0.5% -1:200000 IJ SOLN
INTRAMUSCULAR | Status: AC
  Filled 2018-08-12: qty 30

## 2018-08-12 SURGICAL SUPPLY — 53 items
ATTUNE MED DOME PAT 38 KNEE (Knees) ×1 IMPLANT
ATTUNE PS FEM RT SZ 6 CEM KNEE (Femur) ×1 IMPLANT
ATTUNE PSRP INSR SZ6 5 KNEE (Insert) ×1 IMPLANT
BAG ZIPLOCK 12X15 (MISCELLANEOUS) ×2 IMPLANT
BASE TIBIA ATTUNE KNEE SYS SZ6 (Knees) IMPLANT
BENZOIN TINCTURE PRP APPL 2/3 (GAUZE/BANDAGES/DRESSINGS) ×2 IMPLANT
BLADE SAGITTAL 25.0X1.19X90 (BLADE) ×2 IMPLANT
BLADE SAW SGTL 11.0X1.19X90.0M (BLADE) ×2 IMPLANT
BLADE SURG SZ10 CARB STEEL (BLADE) ×4 IMPLANT
BNDG ELASTIC 6X5.8 VLCR STR LF (GAUZE/BANDAGES/DRESSINGS) ×3 IMPLANT
BOOTIES KNEE HIGH SLOAN (MISCELLANEOUS) ×2 IMPLANT
BOWL SMART MIX CTS (DISPOSABLE) ×2 IMPLANT
CEMENT HV SMART SET (Cement) ×4 IMPLANT
CLSR STERI-STRIP ANTIMIC 1/2X4 (GAUZE/BANDAGES/DRESSINGS) ×1 IMPLANT
COVER SURGICAL LIGHT HANDLE (MISCELLANEOUS) ×2 IMPLANT
COVER WAND RF STERILE (DRAPES) IMPLANT
CUFF TOURN SGL QUICK 34 (TOURNIQUET CUFF) ×1
CUFF TRNQT CYL 34X4.125X (TOURNIQUET CUFF) ×1 IMPLANT
DECANTER SPIKE VIAL GLASS SM (MISCELLANEOUS) ×4 IMPLANT
DRAPE U-SHAPE 47X51 STRL (DRAPES) ×2 IMPLANT
DRESSING AQUACEL AG SP 3.5X10 (GAUZE/BANDAGES/DRESSINGS) IMPLANT
DRSG AQUACEL AG ADV 3.5X10 (GAUZE/BANDAGES/DRESSINGS) ×2 IMPLANT
DRSG AQUACEL AG SP 3.5X10 (GAUZE/BANDAGES/DRESSINGS) ×2
DURAPREP 26ML APPLICATOR (WOUND CARE) ×2 IMPLANT
ELECT REM PT RETURN 15FT ADLT (MISCELLANEOUS) ×2 IMPLANT
GLOVE BIOGEL PI IND STRL 8 (GLOVE) ×2 IMPLANT
GLOVE BIOGEL PI INDICATOR 8 (GLOVE) ×2
GLOVE ECLIPSE 7.5 STRL STRAW (GLOVE) ×4 IMPLANT
GOWN STRL REUS W/TWL XL LVL3 (GOWN DISPOSABLE) ×4 IMPLANT
HANDPIECE INTERPULSE COAX TIP (DISPOSABLE) ×1
HOLDER FOLEY CATH W/STRAP (MISCELLANEOUS) IMPLANT
HOOD PEEL AWAY FLYTE STAYCOOL (MISCELLANEOUS) ×6 IMPLANT
KIT TURNOVER KIT A (KITS) IMPLANT
MANIFOLD NEPTUNE II (INSTRUMENTS) ×2 IMPLANT
NEEDLE HYPO 22GX1.5 SAFETY (NEEDLE) ×2 IMPLANT
NS IRRIG 1000ML POUR BTL (IV SOLUTION) ×2 IMPLANT
PACK ICE MAXI GEL EZY WRAP (MISCELLANEOUS) ×2 IMPLANT
PACK TOTAL KNEE CUSTOM (KITS) ×2 IMPLANT
PADDING CAST COTTON 6X4 STRL (CAST SUPPLIES) ×2 IMPLANT
PIN STEINMAN FIXATION KNEE (PIN) ×1 IMPLANT
PIN THREADED HEADED SIGMA (PIN) ×1 IMPLANT
PROTECTOR NERVE ULNAR (MISCELLANEOUS) ×2 IMPLANT
SET HNDPC FAN SPRY TIP SCT (DISPOSABLE) ×1 IMPLANT
STRIP CLOSURE SKIN 1/2X4 (GAUZE/BANDAGES/DRESSINGS) IMPLANT
SUT MNCRL AB 3-0 PS2 18 (SUTURE) ×2 IMPLANT
SUT VIC AB 0 CT1 36 (SUTURE) ×2 IMPLANT
SUT VIC AB 1 CT1 36 (SUTURE) ×3 IMPLANT
SYR CONTROL 10ML LL (SYRINGE) ×4 IMPLANT
TIBIA ATTUNE KNEE SYS BASE SZ6 (Knees) ×2 IMPLANT
TRAY FOLEY MTR SLVR 14FR STAT (SET/KITS/TRAYS/PACK) ×1 IMPLANT
WATER STERILE IRR 1000ML POUR (IV SOLUTION) ×4 IMPLANT
WRAP KNEE MAXI GEL POST OP (GAUZE/BANDAGES/DRESSINGS) ×1 IMPLANT
YANKAUER SUCT BULB TIP 10FT TU (MISCELLANEOUS) ×2 IMPLANT

## 2018-08-12 NOTE — Discharge Instructions (Signed)

## 2018-08-12 NOTE — Interval H&P Note (Signed)
History and Physical Interval Note:  08/12/2018 7:15 AM  [name]  has presented today for surgery, with the diagnosis of RIGHT KNEE DEGENERATIVE JOINT DISEASE.  The various methods of treatment have been discussed with the patient and family. After consideration of risks, benefits and other options for treatment, the patient has consented to  Procedure(s): TOTAL KNEE ARTHROPLASTY (Right) as a surgical intervention.  The patient's history has been reviewed, patient examined, no change in status, stable for surgery.  I have reviewed the patient's chart and labs.  Questions were answered to the patient's satisfaction.     [name]

## 2018-08-12 NOTE — Plan of Care (Signed)
progressing 

## 2018-08-12 NOTE — Anesthesia Procedure Notes (Signed)
Spinal  Patient location during procedure: OR Start time: 08/12/2018 7:22 AM End time: 08/12/2018 7:25 AM Staffing Anesthesiologist: Montez Hageman, MD Resident/CRNA: Genelle Bal, CRNA Performed: resident/CRNA  Preanesthetic Checklist Completed: patient identified, site marked, surgical consent, pre-op evaluation, timeout performed, IV checked, risks and benefits discussed and monitors and equipment checked Spinal Block Patient position: sitting Prep: DuraPrep Patient monitoring: heart rate, cardiac monitor, continuous pulse ox and blood pressure Approach: midline Location: L3-4 Injection technique: single-shot Needle Needle type: Sprotte  Needle gauge: 24 G Needle length: 9 cm Assessment Sensory level: T4

## 2018-08-12 NOTE — Anesthesia Postprocedure Evaluation (Signed)
Anesthesia Post Note  Patient: International aid/development worker  Procedure(s) Performed: TOTAL KNEE ARTHROPLASTY (Right Knee)     Patient location during evaluation: PACU Anesthesia Type: Spinal Level of consciousness: awake and alert Pain management: pain level controlled Vital Signs Assessment: post-procedure vital signs reviewed and stable Respiratory status: spontaneous breathing and respiratory function stable Cardiovascular status: blood pressure returned to baseline and stable Postop Assessment: no headache, no backache, spinal receding and no apparent nausea or vomiting Anesthetic complications: no    Last Vitals:  Vitals:   08/12/18 0930 08/12/18 1049  BP: 133/65 122/68  Pulse: 64 66  Resp: (!) 21 18  Temp:  (!) 36.4 C  SpO2: 100% 100%    Last Pain:  Vitals:   08/12/18 1049  TempSrc: Oral  PainSc:                  Montez Hageman

## 2018-08-12 NOTE — Op Note (Signed)
NAMEJALAIYA, [name] MEDICAL RECORD [id number] ACCOUNT [id number] DATE OF BIRTH:Mar 24, 1940 FACILITY: [facility] LOCATION: [facility] PHYSICIAN:Dejon Jungman Maudie Mercury, MD  OPERATIVE REPORT  DATE OF PROCEDURE:  08/12/2018  PREOPERATIVE DIAGNOSIS:  End-stage degenerative joint disease, right knee.  POSTOPERATIVE DIAGNOSIS:  End-stage degenerative joint disease, right knee.  PROCEDURE:  Right total knee replacement with an Attune system, size 6 femur, size 6 tibia, 5 mm bridging bearing insert, and a 38 mm all polyethylene patella.  SURGEON:  Dorna Leitz, MD  ASSISTANT:  Gaspar Skeeters, PA-C, was present for the entire case and assisted by bone cuts, manipulation of the leg, and closing to minimize OR time.  BRIEF HISTORY:  The patient is a 78 year old female with a long history of significant bilateral end-stage arthritis.  We did a left total knee on her a couple of years ago.  She did great.  She was having worsening right pain, night pain, light activity  pain.  X-ray showed bone-on-bone change, and after failure of conservative care, she was taken to the operating room for a right total knee replacement.  DESCRIPTION OF PROCEDURE:  The patient was taken to the operating room.  After adequate anesthesia was obtained with a spinal anesthetic, the patient was brought to the operating table.  Right leg was prepped and draped in the usual sterile fashion.   Following this, the leg was exsanguinated, blood pressure tourniquet inflated to 300 mmHg.  Following this, a midline incision was made, subcutaneous tissue was dissected down the extensor mechanism, and a medial parapatellar arthrotomy was undertaken.   Following this, attention was turned to the medial and lateral meniscus.  We removed retropatellar fat pad, synovium on the anterior aspect of the femur, and the anterior and posterior cruciates.  Once this was done, retractors were put in place and a  drill hole was made in the femur, and a 4-degree  valgus inclination cut was made with 9 mm of distal bone resected.  Following this, attention was turned to sizing.  It sized initially to a 7.  We used a 6 on the opposite side.  We tried a 7.  It was a  little bit wide in the AP, and I felt that we had room to go without notching, so we cut her down to a 6 and split the difference.  We put a little more anterior, a little more posteriorly.  I think it gave [place] a very nice fit.  At that point, we did a 6  box cut.  Attention was turned to the tibia.  It was cut with a 3-degree posterior slope perpendicular to its long axis, sized to a 6.  It was drilled and keeled.  Trials were put in place.  We went to the patella, cut it down to the level of 13 mm.  A  38 poly paddle was chosen, and lugs were drilled.  At this point, we put her through a range of motion.  Excellent range of motion and stability were achieved.  Trial components were removed.  The knee was copiously and thoroughly irrigated with  pulsatile lavage irrigation and suctioned dry.  The final components were then cemented into place, size 6 tibia, size 6 femur, 5 mm bridging bearing trial was placed, and a 38 all poly patella placed and held with a clamp.  All excess bone cement was  removed.  Excellent range of motion and stability were achieved here.  Once the cement was completely hardened and all excess bone cement had  been removed, tourniquet was let down.  All bleeders controlled with electrocautery.  Final 5 poly was opened  and placed.  Excellent range of motion and stability were achieved.  The medial parapatellar arthrotomy was closed with 1 Vicryl running.  The skin was closed with 0 and 2-0 Vicryl and 3-0 Monocryl subcuticular.  Benzoin and Steri-Strips were applied, a  sterile compressive dressing was applied, and the patient was taken to recovery and was noted to be in satisfactory condition.  Estimated blood loss for the procedure was minimal.  LN/NUANCE  D:08/12/2018 T:08/12/2018  JOB:[id number]

## 2018-08-12 NOTE — Anesthesia Preprocedure Evaluation (Signed)
Anesthesia Evaluation  Patient identified by MRN, date of birth, ID band Patient awake    Reviewed: Allergy & Precautions, NPO status , Patient's Chart, lab work & pertinent test results  Airway Mallampati: II  TM Distance: >3 FB Neck ROM: Full    Dental no notable dental hx. (+) Edentulous Upper, Edentulous Lower   Pulmonary neg pulmonary ROS,    Pulmonary exam normal breath sounds clear to auscultation       Cardiovascular negative cardio ROS Normal cardiovascular exam Rhythm:Regular Rate:Normal     Neuro/Psych negative neurological ROS  negative psych ROS   GI/Hepatic Neg liver ROS, GERD  Controlled,  Endo/Other  Hypothyroidism   Renal/GU negative Renal ROS  negative genitourinary   Musculoskeletal negative musculoskeletal ROS (+)   Abdominal   Peds negative pediatric ROS (+)  Hematology negative hematology ROS (+)   Anesthesia Other Findings   Reproductive/Obstetrics negative OB ROS                             Anesthesia Physical Anesthesia Plan  ASA: II  Anesthesia Plan: Spinal   Post-op Pain Management:  Regional for Post-op pain   Induction:   PONV Risk Score and Plan: 2 and Treatment may vary due to age or medical condition and Propofol infusion  Airway Management Planned: Simple Face Mask  Additional Equipment:   Intra-op Plan:   Post-operative Plan:   Informed Consent: I have reviewed the patients History and Physical, chart, labs and discussed the procedure including the risks, benefits and alternatives for the proposed anesthesia with the patient or authorized representative who has indicated his/her understanding and acceptance.     Dental advisory given  Plan Discussed with:   Anesthesia Plan Comments:         Anesthesia Quick Evaluation

## 2018-08-12 NOTE — Evaluation (Signed)
Physical Therapy Evaluation Patient Details Name: [name] MRN: [id number] DOB: 1940-05-02 Today's Date: 08/12/2018   History of Present Illness  78 yo female s/p R TKR on 08/12/18. PMH includes OA, GERD, hypothyroidism, L TKR.  Clinical Impression  Pt presents with R knee pain, decreased R knee ROM, increased time and effort to perform mobility tasks, and decreased activity tolerance due to R knee pain and dizziness post-ambulation. Pt to benefit from acute PT to address deficits. Pt ambulated hallway distance with RW with min guard assist, needing to stop ambulation after 40 ft due to feelings of lightheadedness that subsided when pt rested. Pt educated on ankle pumps (20/hour) to perform this afternoon/evening to increase circulation, to pt's tolerance and limited by pain. PT to progress mobility as tolerated, and will continue to follow acutely.        Follow Up Recommendations Follow surgeon's recommendation for DC plan and follow-up therapies;Supervision for mobility/OOB    Equipment Recommendations  None recommended by PT    Recommendations for Other Services       Precautions / Restrictions Precautions Precautions: Fall Restrictions Weight Bearing Restrictions: No Other Position/Activity Restrictions: WBAT      Mobility  Bed Mobility Overal bed mobility: Needs Assistance Bed Mobility: Supine to Sit     Supine to sit: Min assist;HOB elevated     General bed mobility comments: Min assist for RLE lowering once sitting EOB, verbal cuing for sequencing. increased time and effort to perform.  Transfers Overall transfer level: Needs assistance Equipment used: Rolling walker (2 wheeled) Transfers: Sit to/from Stand Sit to Stand: From elevated surface;Min assist         General transfer comment: min assist for steadying upon standing, verbal cuing for hand placement when rising.  Ambulation/Gait Ambulation/Gait assistance: Min guard;+2 safety/equipment(husband  assisting with recliner) Gait Distance (Feet): 40 Feet Assistive device: Rolling walker (2 wheeled) Gait Pattern/deviations: Step-to pattern;Decreased step length - right;Decreased step length - left;Antalgic;Decreased weight shift to right;Trunk flexed Gait velocity: decr   General Gait Details: Min guard for safety. Verbal cuing for sequencing with step-to gait, placement in RW, turning with RW, and looking forward as opposed to downward.  Stairs            Wheelchair Mobility    Modified Rankin (Stroke Patients Only)       Balance Overall balance assessment: Mild deficits observed, not formally tested                                           Pertinent Vitals/Pain Pain Assessment: 0-10 Pain Score: 4  Pain Location: R knee Pain Descriptors / Indicators: Sore Pain Intervention(s): Patient requesting pain meds-RN notified;Monitored during session;Limited activity within patient's tolerance;Premedicated before session;Repositioned    Home Living Family/patient expects to be discharged to:: Private residence Living Arrangements: Spouse/significant other;Children(pt and spouse going to stay with their daughter until pt is done with PT) Available Help at Discharge: Family Type of Home: House Home Access: Stairs to enter Entrance Stairs-Rails: None Entrance Stairs-Number of Steps: 4 Home Layout: Able to live on main level with bedroom/bathroom;1/2 bath on main level Home Equipment: Walker - 2 wheels;Cane - single point;Bedside commode      Prior Function Level of Independence: Independent               Hand Dominance   Dominant Hand: Right    Extremity/Trunk Assessment  Upper Extremity Assessment Upper Extremity Assessment: Overall WFL for tasks assessed    Lower Extremity Assessment Lower Extremity Assessment: Overall WFL for tasks assessed;RLE deficits/detail RLE Deficits / Details: suspected post-surgical weakness RLE Sensation:  WNL    Cervical / Trunk Assessment Cervical / Trunk Assessment: Normal  Communication   Communication: No difficulties  Cognition Arousal/Alertness: Awake/alert Behavior During Therapy: WFL for tasks assessed/performed Overall Cognitive Status: Within Functional Limits for tasks assessed                                        General Comments      Exercises     Assessment/Plan    PT Assessment Patient needs continued PT services  PT Problem List Decreased strength;Decreased mobility;Decreased range of motion;Decreased activity tolerance;Decreased balance;Pain;Decreased knowledge of use of DME       PT Treatment Interventions DME instruction;Therapeutic activities;Therapeutic exercise;Patient/family education;Gait training;Stair training;Balance training;Functional mobility training    PT Goals (Current goals can be found in the Care Plan section)  Acute Rehab PT Goals Patient Stated Goal: go home PT Goal Formulation: With patient Time For Goal Achievement: 08/19/18 Potential to Achieve Goals: Good    Frequency 7X/week   Barriers to discharge        Co-evaluation               AM-PAC PT "6 Clicks" Mobility  Outcome Measure Help needed turning from your back to your side while in a flat bed without using bedrails?: A Little Help needed moving from lying on your back to sitting on the side of a flat bed without using bedrails?: A Little Help needed moving to and from a bed to a chair (including a wheelchair)?: A Little Help needed standing up from a chair using your arms (e.g., wheelchair or bedside chair)?: A Little Help needed to walk in hospital room?: A Little Help needed climbing 3-5 steps with a railing? : A Lot 6 Click Score: 17    End of Session Equipment Utilized During Treatment: Gait belt Activity Tolerance: Patient tolerated treatment well Patient left: in chair;with call bell/phone within reach;with chair alarm set;with SCD's  reapplied Nurse Communication: Mobility status PT Visit Diagnosis: Other abnormalities of gait and mobility (R26.89);Difficulty in walking, not elsewhere classified (R26.2)    Time: 7169-6789 PT Time Calculation (min) (ACUTE ONLY): 22 min   Charges:   PT Evaluation $PT Eval Low Complexity: 1 Low         Teng Decou Conception Chancy, PT Acute Rehabilitation Services Pager [phone]  Office [phone]   Ajmal Kathan D Elonda Husky 08/12/2018, 5:17 PM

## 2018-08-12 NOTE — Anesthesia Procedure Notes (Addendum)
Anesthesia Regional Block: Adductor canal block   Pre-Anesthetic Checklist: ,, timeout performed, Correct Patient, Correct Site, Correct Laterality, Correct Procedure, Correct Position, site marked, Risks and benefits discussed,  Surgical consent,  Pre-op evaluation,  At surgeon's request and post-op pain management  Laterality: Right and Lower  Prep: Maximum Sterile Barrier Precautions used, chloraprep       Needles:  Injection technique: Single-shot  Needle Type: Echogenic Stimulator Needle     Needle Length: 10cm      Additional Needles:   Procedures:,,,, ultrasound used (permanent image in chart),,,,  Narrative:  Start time: 08/12/2018 7:06 AM End time: 08/12/2018 7:10 AM Injection made incrementally with aspirations every 5 mL.  Performed by: Personally  Anesthesiologist: Montez Hageman, MD  Additional Notes: Risks, benefits and alternative to block explained extensively.  Patient tolerated procedure well, without complications.

## 2018-08-12 NOTE — Brief Op Note (Signed)
08/12/2018  8:45 AM  PATIENT:  [name]  78 y.o. female  PRE-OPERATIVE DIAGNOSIS:  RIGHT KNEE DEGENERATIVE JOINT DISEASE  POST-OPERATIVE DIAGNOSIS:  RIGHT KNEE DEGENERATIVE JOINT DISEASE  PROCEDURE:  Procedure(s): TOTAL KNEE ARTHROPLASTY (Right)  SURGEON:  Surgeon(s) and Role:    Dorna Leitz, MD - Primary  PHYSICIAN ASSISTANT:   ASSISTANTS: jim bethune   ANESTHESIA:   spinal  EBL:  50 mL   BLOOD ADMINISTERED:none  DRAINS: none   LOCAL MEDICATIONS USED:  MARCAINE    and OTHER experel  SPECIMEN:  No Specimen  DISPOSITION OF SPECIMEN:  N/A  COUNTS:  YES  TOURNIQUET:   Total Tourniquet Time Documented: Thigh (Right) - 49 minutes Total: Thigh (Right) - 49 minutes   DICTATION: .Other Dictation: Dictation Number [id number]  PLAN OF CARE: Admit for overnight observation  PATIENT DISPOSITION:  PACU - hemodynamically stable.   Delay start of Pharmacological VTE agent (>24hrs) due to surgical blood loss or risk of bleeding: no

## 2018-08-12 NOTE — Progress Notes (Signed)
Please allow husband Mashayla Lavin to be with patient while on floor in hospital as she has dementia and his presence will be necessary to ensure best outcome. Rabia Argote

## 2018-08-12 NOTE — Transfer of Care (Signed)
Immediate Anesthesia Transfer of Care Note  Patient: [name]  Procedure(s) Performed: TOTAL KNEE ARTHROPLASTY (Right Knee)  Patient Location: PACU  Anesthesia Type:Regional and Spinal  Level of Consciousness: awake, alert  and oriented  Airway & Oxygen Therapy: Patient Spontanous Breathing and Patient connected to face mask oxygen  Post-op Assessment: Report given to RN and Post -op Vital signs reviewed and stable  Post vital signs: Reviewed and stable  Last Vitals:  Vitals Value Taken Time  BP 121/63 08/12/18 0913  Temp    Pulse 73 08/12/18 0914  Resp 19 08/12/18 0914  SpO2 100 % 08/12/18 0914  Vitals shown include unvalidated device data.  Last Pain:  Vitals:   08/12/18 0603  TempSrc:   PainSc: 1       Patients Stated Pain Goal: 4 (26/71/24 5809)  Complications: No apparent anesthesia complications

## 2018-08-13 DIAGNOSIS — M1711 Unilateral primary osteoarthritis, right knee: Secondary | ICD-10-CM | POA: Diagnosis not present

## 2018-08-13 LAB — CBC
HCT: 35.4 % — ABNORMAL LOW (ref 36.0–46.0)
Hemoglobin: 10.9 g/dL — ABNORMAL LOW (ref 12.0–15.0)
MCH: 30.4 pg (ref 26.0–34.0)
MCHC: 30.8 g/dL (ref 30.0–36.0)
MCV: 98.9 fL (ref 80.0–100.0)
Platelets: 131 10*3/uL — ABNORMAL LOW (ref 150–400)
RBC: 3.58 MIL/uL — ABNORMAL LOW (ref 3.87–5.11)
RDW: 14 % (ref 11.5–15.5)
WBC: 5.5 10*3/uL (ref 4.0–10.5)
nRBC: 0 % (ref 0.0–0.2)

## 2018-08-13 NOTE — TOC Initial Note (Signed)
Transition of Care [facility]) - Initial/Assessment Note    Patient Details  Name: [name] MRN: [id number] Date of Birth: 02-16-1940  Transition of Care (TOC) CM/SW Contact:    Joaquin Courts, RN Phone Number: 08/13/2018, 12:44 PM  Clinical Narrative:    CM spoke with patient at bedside. [facility] for [place]. Patient reports she has rolling walker and 3-in-1 at home.                Expected Discharge Plan: [place] Barriers to Discharge: No Barriers Identified   Patient Goals and CMS Choice Patient states their goals for this hospitalization and ongoing recovery are:: to go home CMS Medicare.gov Compare Post Acute Care list provided to:: Patient Choice offered to / list presented to : Patient  Expected Discharge Plan and Services Expected Discharge Plan: [place]   Discharge Planning Services: CM Consult Post Acute Care Choice: [place] arrangements for the past 2 months: Single Family Home Expected Discharge Date: 08/13/18               DME Arranged: N/A DME Agency: NA       HH Arranged: PT HH Agency: [facility] ([facility] [employer]) Date [place]: 08/13/18 Time [place]: 1244 Representative spoke with at [place]: pre arrnaged in MD office  Prior Living Arrangements/Services Living arrangements for the past 2 months: [place] with:: Spouse Patient language and need for interpreter reviewed:: Yes Do you feel safe going back to the place where you live?: Yes      Need for Family Participation in Patient Care: Yes (Comment) Care giver support system in place?: Yes (comment)   Criminal Activity/Legal Involvement Pertinent to Current Situation/Hospitalization: No - Comment as needed  Activities of Daily Living Home Assistive Devices/Equipment: Eyeglasses, Grab bars in shower, Bedside commode/3-in-1, Walker (specify type), Cane (specify quad or  straight) ADL Screening (condition at time of admission) Patient's cognitive ability adequate to safely complete daily activities?: Yes Is the patient deaf or have difficulty hearing?: No Does the patient have difficulty seeing, even when wearing glasses/contacts?: No Does the patient have difficulty concentrating, remembering, or making decisions?: No Patient able to express need for assistance with ADLs?: Yes Does the patient have difficulty dressing or bathing?: No Independently performs ADLs?: Yes (appropriate for developmental age) Does the patient have difficulty walking or climbing stairs?: Yes Weakness of Legs: None Weakness of Arms/Hands: None  Permission Sought/Granted                  Emotional Assessment Appearance:: Appears stated age Attitude/Demeanor/Rapport: Engaged Affect (typically observed): Accepting Orientation: : Oriented to Place, Oriented to  Time, Oriented to Situation, Oriented to Self   Psych Involvement: No (comment)  Admission diagnosis:  RIGHT KNEE DEGENERATIVE JOINT DISEASE Patient Active Problem List   Diagnosis Date Noted  . Primary osteoarthritis of right knee 08/12/2018  . Primary osteoarthritis of left knee 12/10/2017   PCP:  System, Pcp Not In Pharmacy:   [facility] [address] [place] [address] Phone: [phone] Fax: [phone]     Social Determinants of Health (SDOH) Interventions    Readmission Risk Interventions No flowsheet data found.

## 2018-08-13 NOTE — Discharge Summary (Signed)
Patient ID: [name] MRN: [id number] DOB/AGE: 07-19-1940 78 y.o.  Admit date: 08/12/2018 Discharge date: 08/13/2018  Admission Diagnoses:  Principal Problem:   Primary osteoarthritis of right knee   Discharge Diagnoses:  Same  Past Medical History:  Diagnosis Date  . Arthritis   . GERD (gastroesophageal reflux disease)   . History of appendicitis   . Hypothyroidism   . Melanoma ([place])    Right shoulder  . Rheumatic fever    as a kid    Surgeries: Procedure(s): Right TOTAL KNEE ARTHROPLASTY on 08/12/2018   Consultants:   Discharged Condition: Improved  Hospital Course: [name] is an 78 y.o. female who was admitted 08/12/2018 for operative treatment ofPrimary osteoarthritis of right knee. Patient has severe unremitting pain that affects sleep, daily activities, and work/hobbies. After pre-op clearance the patient was taken to the operating room on 08/12/2018 and underwent  Procedure(s): Right TOTAL KNEE ARTHROPLASTY.    Patient was given perioperative antibiotics:  Anti-infectives (From admission, onward)   Start     Dose/Rate Route Frequency Ordered Stop   08/12/18 1330  ceFAZolin (ANCEF) IVPB 2g/100 mL premix     2 g 200 mL/hr over 30 Minutes Intravenous Every 6 hours 08/12/18 1050 08/12/18 1911   08/12/18 0600  ceFAZolin (ANCEF) IVPB 2g/100 mL premix     2 g 200 mL/hr over 30 Minutes Intravenous On call to O.R. 08/12/18 1025 08/12/18 0757       Patient was given sequential compression devices, early ambulation, and chemoprophylaxis to prevent DVT.  Patient benefited maximally from hospital stay and there were no complications.    Recent vital signs:  Patient Vitals for the past 24 hrs:  BP Temp Temp src Pulse Resp SpO2  08/13/18 1018 (!) 103/51 (!) 97.5 F (36.4 C) Oral (!) 56 18 100 %  08/13/18 0540 (!) 104/57 97.6 F (36.4 C) Oral (!) 55 16 100 %  08/13/18 0116 (!) 101/55 - - 60 16 100 %  08/12/18 2053 127/61 (!) 97.5 F (36.4 C) Oral (!) 54 16  100 %  08/12/18 1722 109/68 (!) 97.5 F (36.4 C) Oral (!) 57 18 100 %  08/12/18 1354 135/65 - - 60 15 100 %  08/12/18 1258 131/67 - - (!) 55 16 100 %  08/12/18 1158 135/69 - - 62 16 100 %     Recent laboratory studies:  Recent Labs    08/11/18 1158 08/13/18 0339  WBC 4.3 5.5  HGB 13.2 10.9*  HCT 43.7 35.4*  PLT 167 131*  NA 139  --   K 4.4  --   CL 103  --   CO2 25  --   BUN 20  --   CREATININE 0.73  --   GLUCOSE 96  --   INR 1.0  --   CALCIUM 9.3  --      Discharge Medications:   Allergies as of 08/13/2018      Reactions   Other    Garlic, milk, soy beans, baby lima beans and grapes cause headaches      Medication List    TAKE these medications   acetaminophen 500 MG tablet Commonly known as: TYLENOL Take 1,000 mg by mouth every 8 (eight) hours as needed for mild pain or headache.   alendronate 70 MG tablet Commonly known as: FOSAMAX Take 70 mg by mouth every Saturday.   aspirin EC 325 MG tablet Take 1 tablet (325 mg total) by mouth 2 (two) times daily after a meal.  Take x 1 month post op to decrease risk of blood clots.   CALCIUM ACETATE-MAGNESIUM CARB PO Take 1 tablet by mouth daily.   co-enzyme Q-10 30 MG capsule Take 30 mg by mouth daily.   docusate sodium 100 MG capsule Commonly known as: Colace Take 1 capsule (100 mg total) by mouth 2 (two) times daily.   Fish Oil 1000 MG Caps Take 1,000 mg by mouth daily.   Grape Seed Extract 50 MG Caps Take 100 mg by mouth daily.   HYDROcodone-acetaminophen 5-325 MG tablet Commonly known as: Norco Take 1-2 tablets by mouth every 6 (six) hours as needed for moderate pain.   levothyroxine 100 MCG tablet Commonly known as: SYNTHROID Take 100 mcg by mouth every other day. Alternate taking 165mcg one day and 68mcg the next.   levothyroxine 88 MCG tablet Commonly known as: SYNTHROID Take 88 mcg by mouth every other day. Alternate taking 163mcg one day and 3mcg the next.   multivitamin with minerals Tabs  tablet Take 1 tablet by mouth daily.   niacin 500 MG tablet Take 500 mg by mouth daily.   OVER THE COUNTER MEDICATION Take 1 tablet by mouth 3 (three) times daily with meals. Pepcid with Hydrochloric Acid 600 mg   PROBIOTIC PO Take 1 capsule by mouth daily.   tiZANidine 2 MG tablet Commonly known as: ZANAFLEX Take 1 tablet (2 mg total) by mouth every 8 (eight) hours as needed for muscle spasms.   zinc sulfate 220 (50 Zn) MG capsule Take 220 mg by mouth daily.            Discharge Care Instructions  (From admission, onward)         Start     Ordered   08/13/18 0000  Weight bearing as tolerated    Question Answer Comment  Laterality right   Extremity Lower      08/13/18 1148          Diagnostic Studies: No results found.  Disposition: Discharge disposition: 01-Home or Self Care       Discharge Instructions    CPM   Complete by: As directed    Continuous passive motion machine (CPM):      Use the CPM from 0 to 60 for 8 hours per day.      You may increase by 5-10 per day.  You may break it up into 2 or 3 sessions per day.      Use CPM for 1-2 weeks or until you are told to stop.   Call MD / Call 911   Complete by: As directed    If you experience chest pain or shortness of breath, CALL 911 and be transported to the hospital emergency room.  If you develope a fever above 101 F, pus (white drainage) or increased drainage or redness at the wound, or calf pain, call your surgeon's office.   Constipation Prevention   Complete by: As directed    Drink plenty of fluids.  Prune juice may be helpful.  You may use a stool softener, such as Colace (over the counter) 100 mg twice a day.  Use MiraLax (over the counter) for constipation as needed.   Diet general   Complete by: As directed    Do not put a pillow under the knee. Place it under the heel.   Complete by: As directed    Increase activity slowly as tolerated   Complete by: As directed    Weight bearing  as  tolerated   Complete by: As directed    Laterality: right   Extremity: Lower      Follow-up Information    Dorna Leitz, MD. Schedule an appointment as soon as possible for a visit in 2 weeks.   Specialty: Orthopedic Surgery Contact information: [place] [address] [phone]            Signed: Erlene Senters 08/13/2018, 11:48 AM

## 2018-08-13 NOTE — Progress Notes (Signed)
    Home health agencies that serve [id number].        Home Health Agencies Search Results  Results List Table  Home Health Agency Information Quality of Patient Care Rating Patient Survey Summary Rating  [facility] [phone] 4 out of 5 stars 4 out of 5 stars  [facility] [phone] 3 out of 5 stars 4 out of 5 stars  [facility] [phone] 4 out of 5 stars 4 out of 5 stars  [facility] [phone] 3  out of 5 stars 4 out of 5 stars  [facility] [phone] 3 out of 5 stars 4 out of 5 stars  [facility] [phone] 4  out of 5 stars 4 out of 5 stars  [facility] [phone] 4 out of 5 stars 3 out of 5 stars  [facility] [phone] 4  out of 5 stars 4 out of 5 stars   Home Health Footnotes  Footnote number Footnote as displayed on Home Health Compare  1 This agency provides services under a federal waiver program to non-traditional, chronic long term population.  2 This agency provides services to a special needs population.  3 Not Available.  4 The number of patient episodes for this measure is too small to report.  5 This measure currently does not have data or provider has been certified/recertified for less than 6 months.  6 The national average for this measure is not provided because of state-to-state differences in data collection.  7 Medicare is not displaying rates for this measure for any home health agency, because of an issue with the data.  8 There were problems with the data and they are being corrected.  9 Zero, or very few, patients met the survey's rules for inclusion. The scores shown, if any, reflect a very small number of surveys and may not accurately tell how an agency is doing.  10 Survey results are based on less than 12 months of data.  11 Fewer than 70 patients completed the survey. Use the scores shown, if any, with caution as the  number of surveys may be too low to accurately tell how an agency is doing.  12 No survey results are available for this period.  13 Data suppressed by CMS for one or more quarters.    

## 2018-08-13 NOTE — Progress Notes (Signed)
Subjective: 1 Day Post-Op Procedure(s) (LRB): TOTAL KNEE ARTHROPLASTY (Right) Patient reports pain as mild.  Taking by mouth and voiding okay.  Positive flatus.  Has some mild nausea relieved with Zofran and crackers.  She and her husband report they are ready to go home.  Objective: Vital signs in last 24 hours: Temp:  [97.5 F (36.4 C)-97.6 F (36.4 C)] 97.5 F (36.4 C) (07/25 1018) Pulse Rate:  [54-62] 56 (07/25 1018) Resp:  [15-18] 18 (07/25 1018) BP: (101-135)/(51-69) 103/51 (07/25 1018) SpO2:  [100 %] 100 % (07/25 1018)  Intake/Output from previous day: 07/24 0701 - 07/25 0700 In: 2780.1 [P.O.:480; I.V.:2300.1] Out: 3250 [Urine:3200; Blood:50] Intake/Output this shift: Total I/O In: 639.8 [P.O.:240; I.V.:399.8] Out: -   Recent Labs    08/11/18 1158 08/13/18 0339  HGB 13.2 10.9*   Recent Labs    08/11/18 1158 08/13/18 0339  WBC 4.3 5.5  RBC 4.53 3.58*  HCT 43.7 35.4*  PLT 167 131*   Recent Labs    08/11/18 1158  NA 139  K 4.4  CL 103  CO2 25  BUN 20  CREATININE 0.73  GLUCOSE 96  CALCIUM 9.3   Recent Labs    08/11/18 1158  INR 1.0  Right knee exam:  Neurovascular intact Sensation intact distally Intact pulses distally Dorsiflexion/Plantar flexion intact Incision: dressing C/D/I Compartment soft   Assessment/Plan: 1 Day Post-Op Procedure(s) (LRB): TOTAL KNEE ARTHROPLASTY (Right) Plan: Aspirin 325 mg twice daily for DVT prophylaxis x1 month postop. Up with therapy Discharge home with home health Follow-up with Dr. Berenice Primas in 2 weeks.   Patient's anticipated LOS is less than 2 midnights, meeting these requirements: - Lives within 1 hour of care at daughter's home. - Has a competent adult at home to recover with post-op recover - NO history of  - Chronic pain requiring opiods  - Diabetes  - Coronary Artery Disease  - Heart failure  - Heart attack  - Stroke  - DVT/VTE  - Cardiac arrhythmia  - Respiratory Failure/COPD  - Renal  failure  - Anemia  - Advanced Liver disease       Erlene Senters 08/13/2018, 11:45 AM

## 2018-08-13 NOTE — Progress Notes (Signed)
Physical Therapy Treatment Patient Details Name: [name] MRN: [id number] DOB: 06-Jul-1940 Today's Date: 08/13/2018    History of Present Illness 78 yo female s/p R TKR on 08/12/18. PMH includes OA, GERD, hypothyroidism, L TKR.    PT Comments    Pt ambulated in hallway and practiced safe stair technique with spouse observing and then assisting with holding RW.  Pt provided with stair and HEP handouts.  Pt and spouse feel ready for pt to d/c home today.    Follow Up Recommendations  Follow surgeon's recommendation for DC plan and follow-up therapies;Supervision for mobility/OOB     Equipment Recommendations  None recommended by PT    Recommendations for Other Services       Precautions / Restrictions Precautions Precautions: Fall;Knee Restrictions Weight Bearing Restrictions: No Other Position/Activity Restrictions: WBAT    Mobility  Bed Mobility               General bed mobility comments: pt ambulating out of bathroom with nurse tech on arrival  Transfers Overall transfer level: Needs assistance Equipment used: Rolling walker (2 wheeled) Transfers: Sit to/from Stand Sit to Stand: Min guard         General transfer comment: min/guard for safety, cues for hand placement for self assist  Ambulation/Gait Ambulation/Gait assistance: Min guard Gait Distance (Feet): 150 Feet Assistive device: Rolling walker (2 wheeled) Gait Pattern/deviations: Step-to pattern;Antalgic;Decreased weight shift to right;Trunk flexed Gait velocity: decr   General Gait Details: Min guard for safety. Verbal cuing for sequencing, placement in RW, turning with RW, and posture   Stairs Stairs: Yes Stairs assistance: Min guard Stair Management: Step to pattern;Backwards;With walker Number of Stairs: 2 General stair comments: verbal cues for sequence, RW positioning, safety; spouse present and observed pt perform once and then assisted with pt performing again, pt and spouse aware  to use gait belt and daughter to also assist pt home; provided stair handout   Wheelchair Mobility    Modified Rankin (Stroke Patients Only)       Balance                                            Cognition Arousal/Alertness: Awake/alert Behavior During Therapy: WFL for tasks assessed/performed Overall Cognitive Status: Within Functional Limits for tasks assessed                                        Exercises    General Comments        Pertinent Vitals/Pain Pain Assessment: 0-10 Pain Score: 3  Pain Location: R knee Pain Descriptors / Indicators: Sore Pain Intervention(s): Repositioned;Monitored during session    Home Living                      Prior Function            PT Goals (current goals can now be found in the care plan section) Progress towards PT goals: Progressing toward goals    Frequency    7X/week      PT Plan Current plan remains appropriate    Co-evaluation              AM-PAC PT "6 Clicks" Mobility   Outcome Measure  Help needed turning from your back to your side  while in a flat bed without using bedrails?: A Little Help needed moving from lying on your back to sitting on the side of a flat bed without using bedrails?: A Little Help needed moving to and from a bed to a chair (including a wheelchair)?: A Little Help needed standing up from a chair using your arms (e.g., wheelchair or bedside chair)?: A Little Help needed to walk in hospital room?: A Little Help needed climbing 3-5 steps with a railing? : A Little 6 Click Score: 18    End of Session Equipment Utilized During Treatment: Gait belt Activity Tolerance: Patient tolerated treatment well Patient left: in chair;with call bell/phone within reach;with family/visitor present;with nursing/sitter in room Nurse Communication: Mobility status PT Visit Diagnosis: Other abnormalities of gait and mobility (R26.89);Difficulty in  walking, not elsewhere classified (R26.2)     Time: 8677-3736 PT Time Calculation (min) (ACUTE ONLY): 20 min  Charges:  $Gait Training: 8-22 mins                     Carmelia Bake, PT, DPT Acute Rehabilitation Services Office: [phone] Pager: [phone]   Trena Platt 08/13/2018, 3:12 PM

## 2018-08-13 NOTE — Progress Notes (Signed)
Physical Therapy Treatment Patient Details Name: [name] MRN: [id number] DOB: 03-11-1940 Today's Date: 08/13/2018    History of Present Illness 78 yo female s/p R TKR on 08/12/18. PMH includes OA, GERD, hypothyroidism, L TKR.    PT Comments    Pt ambulated in hallway and performed LE exercises.  Will return for afternoon session to practice safe stair technique.    Follow Up Recommendations  Follow surgeon's recommendation for DC plan and follow-up therapies;Supervision for mobility/OOB     Equipment Recommendations  None recommended by PT    Recommendations for Other Services       Precautions / Restrictions Precautions Precautions: Fall;Knee Restrictions Weight Bearing Restrictions: No Other Position/Activity Restrictions: WBAT    Mobility  Bed Mobility               General bed mobility comments: pt up in recliner on on arrival  Transfers Overall transfer level: Needs assistance Equipment used: Rolling walker (2 wheeled) Transfers: Sit to/from Stand Sit to Stand: Min assist         General transfer comment: min assist for steadying upon standing and placing R LE forward upon sitting, verbal cuing for hand placement when rising.  Ambulation/Gait Ambulation/Gait assistance: Min guard Gait Distance (Feet): 80 Feet Assistive device: Rolling walker (2 wheeled) Gait Pattern/deviations: Step-to pattern;Antalgic;Decreased weight shift to right;Trunk flexed Gait velocity: decr   General Gait Details: Min guard for safety. Verbal cuing for sequencing, placement in RW, turning with RW, and posture   Stairs             Wheelchair Mobility    Modified Rankin (Stroke Patients Only)       Balance                                            Cognition Arousal/Alertness: Awake/alert Behavior During Therapy: WFL for tasks assessed/performed Overall Cognitive Status: Within Functional Limits for tasks assessed                                         Exercises Total Joint Exercises Ankle Circles/Pumps: AROM;10 reps;Both Quad Sets: AROM;Both;10 reps Heel Slides: 10 reps;Right;AAROM Hip ABduction/ADduction: AAROM;Right;10 reps Straight Leg Raises: AAROM;Right;10 reps Goniometric ROM: approx 60* AAROM R knee flexion    General Comments        Pertinent Vitals/Pain Pain Assessment: 0-10 Pain Score: 2  Pain Location: R knee Pain Descriptors / Indicators: Sore Pain Intervention(s): Monitored during session;Premedicated before session;Repositioned    Home Living                      Prior Function            PT Goals (current goals can now be found in the care plan section) Progress towards PT goals: Progressing toward goals    Frequency    7X/week      PT Plan Current plan remains appropriate    Co-evaluation              AM-PAC PT "6 Clicks" Mobility   Outcome Measure  Help needed turning from your back to your side while in a flat bed without using bedrails?: A Little Help needed moving from lying on your back to sitting on the side of a flat  bed without using bedrails?: A Little Help needed moving to and from a bed to a chair (including a wheelchair)?: A Little Help needed standing up from a chair using your arms (e.g., wheelchair or bedside chair)?: A Little Help needed to walk in hospital room?: A Little Help needed climbing 3-5 steps with a railing? : A Lot 6 Click Score: 17    End of Session Equipment Utilized During Treatment: Gait belt Activity Tolerance: Patient tolerated treatment well Patient left: in chair;with call bell/phone within reach;with chair alarm set;with family/visitor present Nurse Communication: Mobility status PT Visit Diagnosis: Other abnormalities of gait and mobility (R26.89);Difficulty in walking, not elsewhere classified (R26.2)     Time: 0017-4944 PT Time Calculation (min) (ACUTE ONLY): 23 min  Charges:  $Gait  Training: 8-22 mins $Therapeutic Exercise: 8-22 mins                    [name], PT, DPT Acute Rehabilitation Services Office: [phone] Pager: [phone]   Trena Platt 08/13/2018, 3:08 PM

## 2018-08-15 ENCOUNTER — Encounter (HOSPITAL_COMMUNITY): Payer: Self-pay | Admitting: Orthopedic Surgery

## 2020-09-17 IMAGING — CR DG CHEST 2V
2 series · 2 of 2 positions shown · non-contrast
Comparison: None.

CLINICAL DATA: Preop total knee replacement

EXAM:
CHEST - 2 VIEW

[w chest pa]
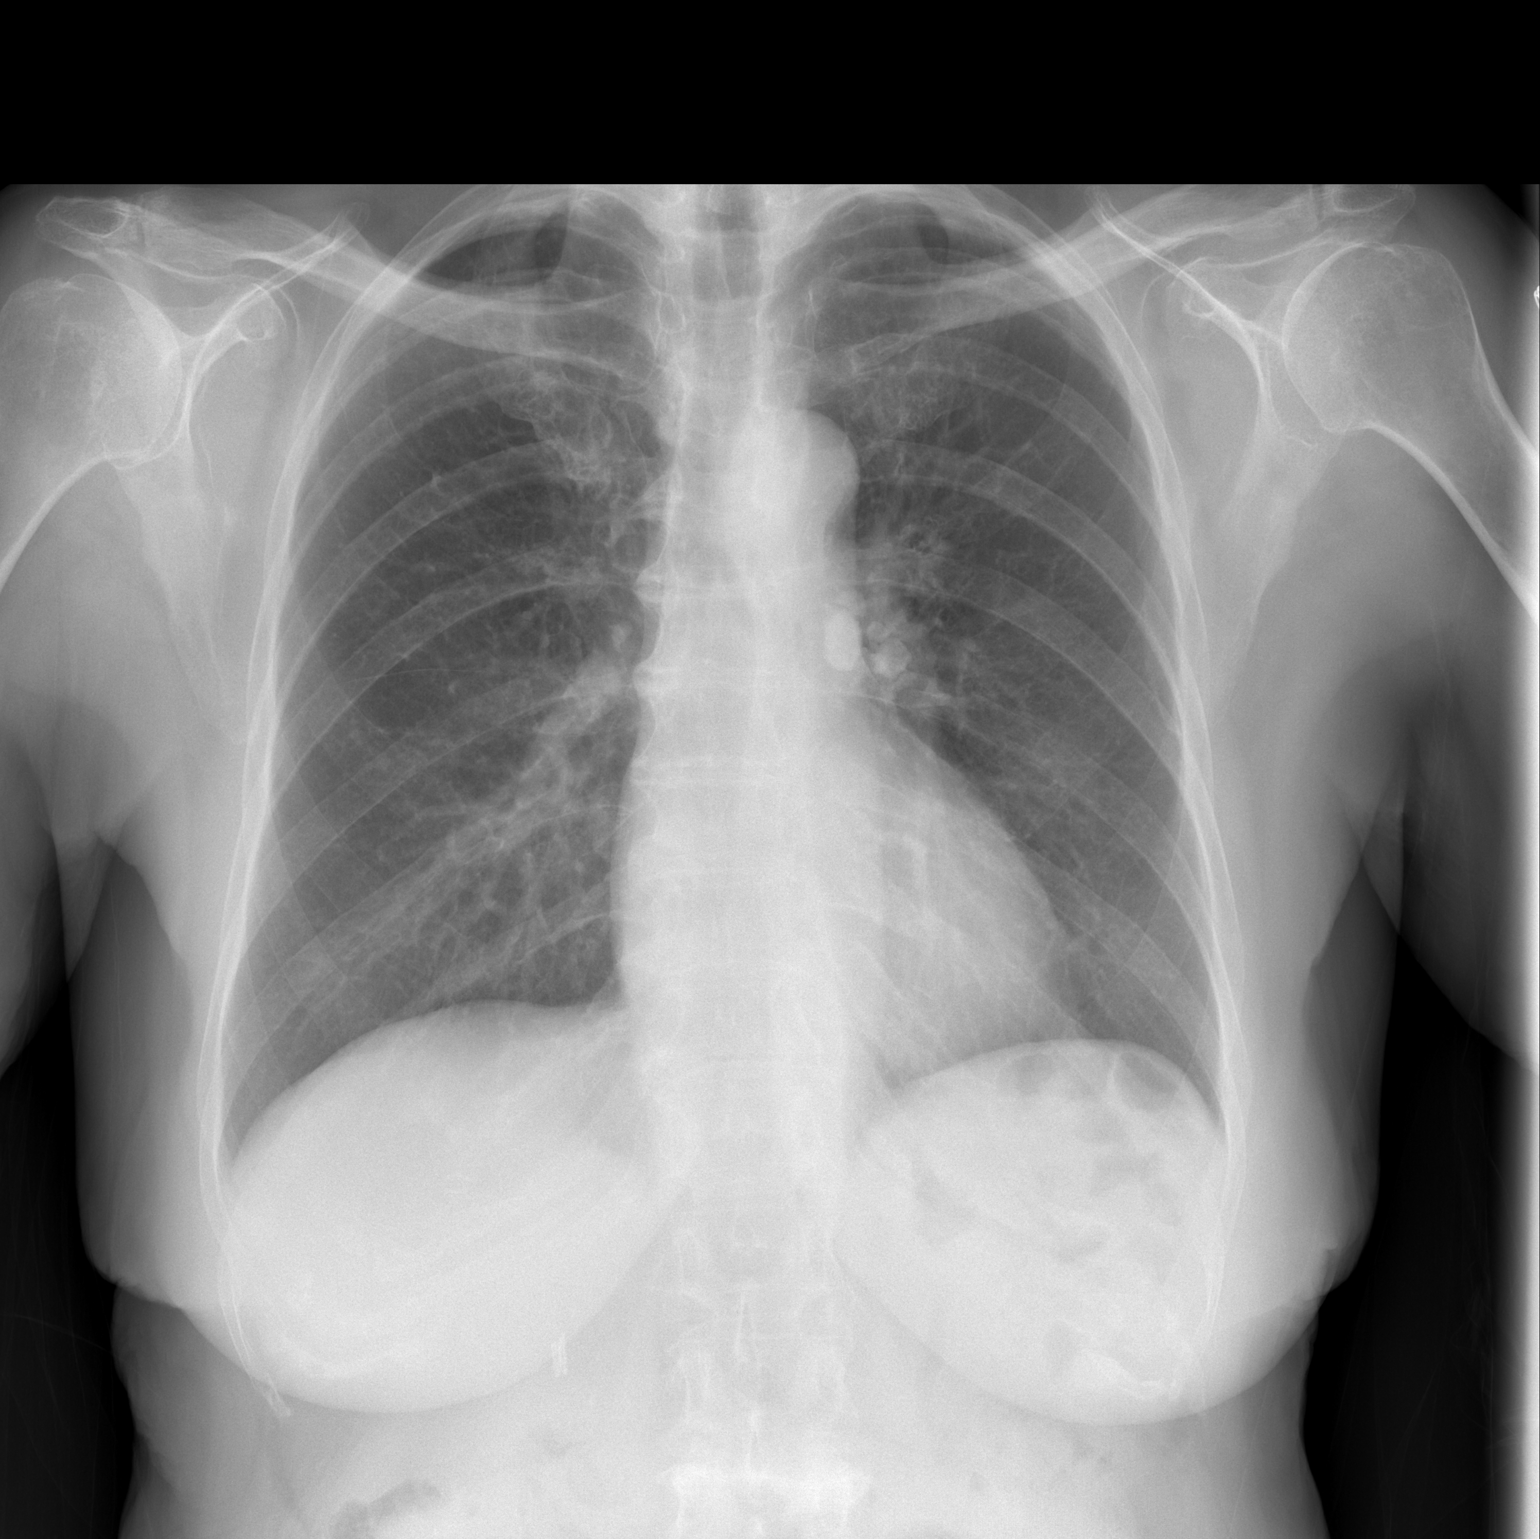

[w chest lat]
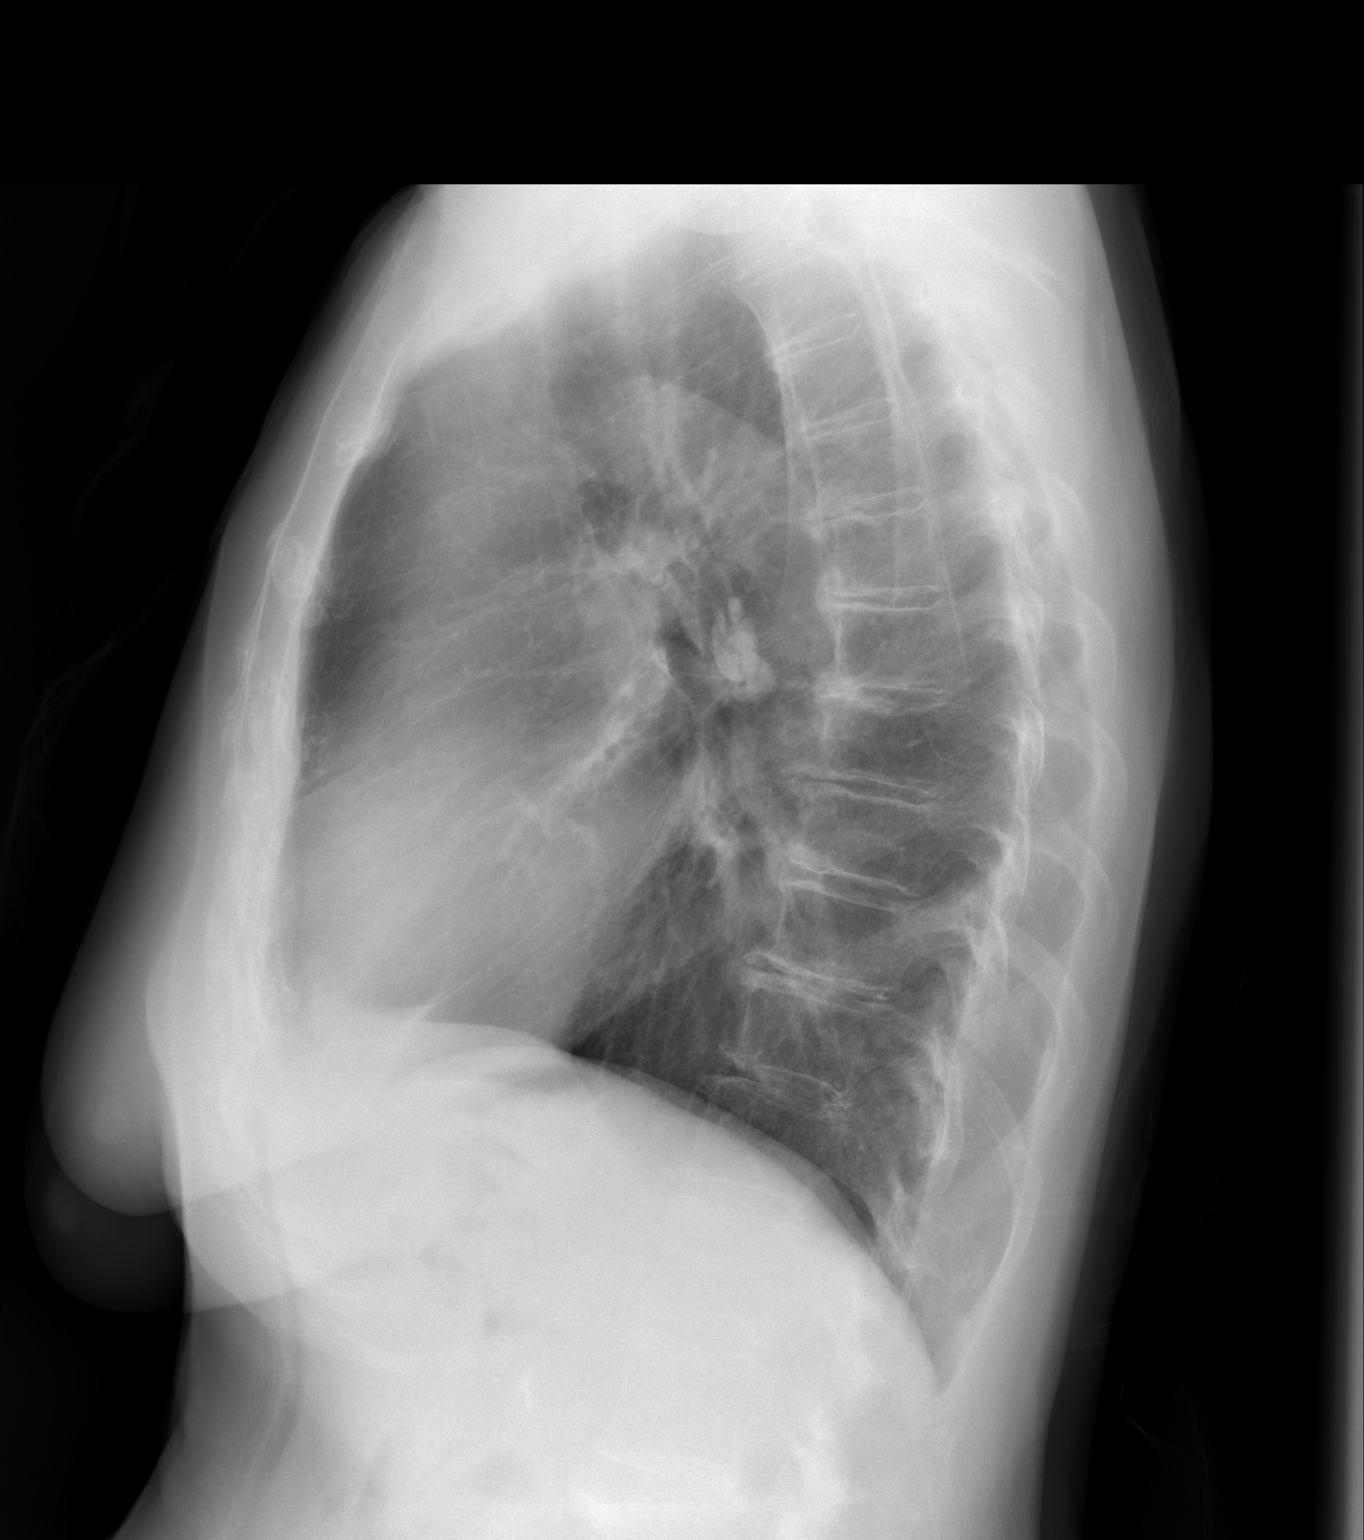

[2 of 2 positions shown; findings below may reference images not displayed]

FINDINGS: Heart and mediastinal contours are within normal limits. No focal
opacities or effusions. No acute bony abnormality.
IMPRESSION: No active cardiopulmonary disease.

## 2021-11-11 ENCOUNTER — Emergency Department (HOSPITAL_COMMUNITY): Payer: Medicare HMO

## 2021-11-11 ENCOUNTER — Emergency Department (HOSPITAL_COMMUNITY)
Admission: EM | Admit: 2021-11-11 | Discharge: 2021-11-12 | Disposition: A | Discharge status: Home / Self Care | Payer: Medicare HMO | Attending: Emergency Medicine | Admitting: Emergency Medicine

## 2021-11-11 ENCOUNTER — Other Ambulatory Visit: Payer: Self-pay

## 2021-11-11 ENCOUNTER — Encounter (HOSPITAL_COMMUNITY): Payer: Self-pay

## 2021-11-11 DIAGNOSIS — E039 Hypothyroidism, unspecified: Secondary | ICD-10-CM | POA: Insufficient documentation

## 2021-11-11 DIAGNOSIS — G309 Alzheimer's disease, unspecified: Secondary | ICD-10-CM | POA: Diagnosis not present

## 2021-11-11 DIAGNOSIS — Z7982 Long term (current) use of aspirin: Secondary | ICD-10-CM | POA: Diagnosis not present

## 2021-11-11 DIAGNOSIS — W1839XA Other fall on same level, initial encounter: Secondary | ICD-10-CM | POA: Insufficient documentation

## 2021-11-11 DIAGNOSIS — S52044A Nondisplaced fracture of coronoid process of right ulna, initial encounter for closed fracture: Secondary | ICD-10-CM | POA: Insufficient documentation

## 2021-11-11 DIAGNOSIS — R0781 Pleurodynia: Secondary | ICD-10-CM | POA: Insufficient documentation

## 2021-11-11 DIAGNOSIS — E041 Nontoxic single thyroid nodule: Secondary | ICD-10-CM | POA: Insufficient documentation

## 2021-11-11 DIAGNOSIS — S42294A Other nondisplaced fracture of upper end of right humerus, initial encounter for closed fracture: Secondary | ICD-10-CM | POA: Insufficient documentation

## 2021-11-11 DIAGNOSIS — Y93K1 Activity, walking an animal: Secondary | ICD-10-CM | POA: Insufficient documentation

## 2021-11-11 DIAGNOSIS — Z79899 Other long term (current) drug therapy: Secondary | ICD-10-CM | POA: Diagnosis not present

## 2021-11-11 DIAGNOSIS — S0083XA Contusion of other part of head, initial encounter: Secondary | ICD-10-CM | POA: Insufficient documentation

## 2021-11-11 DIAGNOSIS — S4991XA Unspecified injury of right shoulder and upper arm, initial encounter: Secondary | ICD-10-CM | POA: Diagnosis present

## 2021-11-11 DIAGNOSIS — W19XXXA Unspecified fall, initial encounter: Secondary | ICD-10-CM

## 2021-11-11 MED ORDER — FENTANYL CITRATE PF 50 MCG/ML IJ SOSY
25.0000 ug | PREFILLED_SYRINGE | Freq: Once | INTRAMUSCULAR | Status: AC
  Administered 2021-11-11: 25 ug via INTRAVENOUS
  Filled 2021-11-11: qty 1

## 2021-11-11 MED ORDER — ACETAMINOPHEN 500 MG PO TABS: 1000.0000 mg | ORAL_TABLET | Freq: Once | ORAL | Status: DC

## 2021-11-11 MED ORDER — FENTANYL CITRATE PF 50 MCG/ML IJ SOSY
25.0000 ug | PREFILLED_SYRINGE | Freq: Once | INTRAMUSCULAR | Status: AC
  Administered 2021-11-12: 25 ug via INTRAVENOUS
  Filled 2021-11-11: qty 1

## 2021-11-11 NOTE — ED Notes (Signed)
Pt ambulated to the bathroom. Pt's gait is steady and is able to walk with minor assistance.

## 2021-11-11 NOTE — ED Notes (Signed)
Patient transported to CT 

## 2021-11-11 NOTE — ED Provider Notes (Signed)
[facility] Provider Note   CSN: [id number] Arrival date & time: 11/11/21  1813     History {Add pertinent medical, surgical, social history, OB history to HPI:1} Chief Complaint  Patient presents with   [name] is a 81 y.o. female.  HPI     Home Medications Prior to Admission medications   Medication Sig Start Date End Date Taking? Authorizing Provider  acetaminophen (TYLENOL) 500 MG tablet Take 1,000 mg by mouth every 8 (eight) hours as needed for mild pain or headache.    [provider]  alendronate (FOSAMAX) 70 MG tablet Take 70 mg by mouth every Saturday. 05/05/18   [provider]  aspirin EC 325 MG tablet Take 1 tablet (325 mg total) by mouth 2 (two) times daily after a meal. Take x 1 month post op to decrease risk of blood clots. 08/12/18   Gary Fleet, PA-C  CALCIUM ACETATE-MAGNESIUM CARB PO Take 1 tablet by mouth daily.    [provider]  co-enzyme Q-10 30 MG capsule Take 30 mg by mouth daily.    [provider]  docusate sodium (COLACE) 100 MG capsule Take 1 capsule (100 mg total) by mouth 2 (two) times daily. 08/12/18   Gary Fleet, PA-C  Grape Seed Extract 50 MG CAPS Take 100 mg by mouth daily.     [provider]  HYDROcodone-acetaminophen (NORCO) 5-325 MG tablet Take 1-2 tablets by mouth every 6 (six) hours as needed for moderate pain. 08/12/18   Gary Fleet, PA-C  levothyroxine (SYNTHROID) 100 MCG tablet Take 100 mcg by mouth every other day. Alternate taking 178mg one day and 886m the next. 06/27/18   [provider]  levothyroxine (SYNTHROID) 88 MCG tablet Take 88 mcg by mouth every other day. Alternate taking 10067mone day and 83m2mhe next. 06/27/18   [provider]  Multiple Vitamin (MULTIVITAMIN WITH MINERALS) TABS tablet Take 1 tablet by mouth daily.    [provider]  niacin 500 MG tablet Take 500 mg by mouth daily.     [provider]  Omega-3 Fatty Acids (FISH OIL) 1000 MG CAPS Take 1,000 mg by mouth daily.    [provider]  OVER THE COUNTER MEDICATION Take 1 tablet by mouth 3 (three) times daily with meals. Pepcid with Hydrochloric Acid 600 mg     [provider]  Probiotic Product (PROBIOTIC PO) Take 1 capsule by mouth daily.    [provider]  tiZANidine (ZANAFLEX) 2 MG tablet Take 1 tablet (2 mg total) by mouth every 8 (eight) hours as needed for muscle spasms. 08/12/18   BethGary Fleet-C  zinc sulfate 220 (50 Zn) MG capsule Take 220 mg by mouth daily.    [provider]      Allergies    Other    Review of Systems   Review of Systems  Physical Exam Updated Vital Signs BP 109/64 (BP Location: Left Arm)   Pulse 65   Temp 98.8 F (37.1 C) (Oral)   Resp 16   Ht '5\' 6"'$  (1.676 m)   Wt 85.7 kg   SpO2 100%   BMI 30.51 kg/m  Physical Exam  ED Results / Procedures / Treatments   Labs (all labs ordered are listed, but only abnormal results are displayed) Labs Reviewed - No data to display  EKG EKG Interpretation  Date/Time:  Tuesday November 11 2021 18:22:35 EDT Ventricular Rate:  66 PR Interval:  213 QRS Duration: 102 QT Interval:  446 QTC Calculation: 468 R Axis:   32 Text Interpretation: Sinus rhythm Multiple ventricular premature complexes Borderline prolonged PR interval Probable left ventricular hypertrophy  Since prior ECG, PVCs are new Confirmed by Gareth Morgan [phone]) on 11/11/2021 10:19:18 PM  Radiology CT Head Wo Contrast  Result Date: 11/11/2021 CLINICAL DATA:  Fall, with head and neck trauma. EXAM: CT HEAD WITHOUT CONTRAST CT CERVICAL SPINE WITHOUT CONTRAST TECHNIQUE: Multidetector CT imaging of the head and cervical spine was performed following the standard protocol without intravenous contrast. Multiplanar CT image reconstructions of the cervical spine were also generated. RADIATION DOSE REDUCTION: This exam was  performed according to the departmental dose-optimization program which includes automated exposure control, adjustment of the mA and/or kV according to patient size and/or use of iterative reconstruction technique. COMPARISON:  None Available. FINDINGS: CT HEAD FINDINGS Brain: No acute intracranial hemorrhage, midline shift or mass effect. No extra-axial fluid collection. Mild atrophy is noted. Periventricular white matter hypodensities are present bilaterally. No hydrocephalus. Vascular: No hyperdense vessel or unexpected calcification. Skull: Normal. Negative for fracture or focal lesion. Sinuses/Orbits: Mild mucosal thickening in the left frontal sinus. No acute orbital abnormality. Other: Soft tissue swelling is noted over the right orbit and cheek, possible hematoma given history of trauma. CT CERVICAL SPINE FINDINGS Alignment: Normal. Skull base and vertebrae: No acute fracture. No primary bone lesion or focal pathologic process. Soft tissues and spinal canal: No prevertebral fluid or swelling. No visible canal hematoma. Disc levels: Multilevel intervertebral disc space narrowing, uncovertebral osteophyte formation and facet arthropathy, most pronounced at C5-C6. Upper chest: Negative. Other: There is a 1.7 cm nodule in the left lobe of the thyroid gland. Carotid artery calcifications are noted. IMPRESSION: 1. No acute intracranial process. 2. Atrophy with chronic microvascular ischemic changes. 3. Soft tissue swelling over the right orbit and cheek, possible contusion given history of trauma. 4. Multilevel degenerative changes in the cervical spine without evidence of acute fracture. 5. 1.7 cm nodule in the left lobe of the thyroid gland. Ultrasound is recommended for further characterization on non emergent follow-up. Electronically Signed   By: Brett Fairy M.D.   On: 11/11/2021 23:30   CT Cervical Spine Wo Contrast  Result Date: 11/11/2021 CLINICAL DATA:  Fall, with head and neck trauma. EXAM: CT  HEAD WITHOUT CONTRAST CT CERVICAL SPINE WITHOUT CONTRAST TECHNIQUE: Multidetector CT imaging of the head and cervical spine was performed following the standard protocol without intravenous contrast. Multiplanar CT image reconstructions of the cervical spine were also generated. RADIATION DOSE REDUCTION: This exam was performed according to the departmental dose-optimization program which includes automated exposure control, adjustment of the mA and/or kV according to patient size and/or use of iterative reconstruction technique. COMPARISON:  None Available. FINDINGS: CT HEAD FINDINGS Brain: No acute intracranial hemorrhage, midline shift or mass effect. No extra-axial fluid collection. Mild atrophy is noted. Periventricular white matter hypodensities are present bilaterally. No hydrocephalus. Vascular: No hyperdense vessel or unexpected calcification. Skull: Normal. Negative for fracture or focal lesion. Sinuses/Orbits: Mild mucosal thickening in the left frontal sinus. No acute orbital abnormality. Other: Soft tissue swelling is noted over the right orbit and cheek, possible hematoma given history of trauma. CT CERVICAL SPINE FINDINGS Alignment: Normal. Skull base and vertebrae: No acute fracture. No primary bone lesion or focal pathologic process. Soft tissues and spinal canal: No prevertebral fluid or swelling. No visible canal hematoma. Disc levels: Multilevel intervertebral disc space narrowing, uncovertebral osteophyte formation and facet arthropathy,  most pronounced at C5-C6. Upper chest: Negative. Other: There is a 1.7 cm nodule in the left lobe of the thyroid gland. Carotid artery calcifications are noted. IMPRESSION: 1. No acute intracranial process. 2. Atrophy with chronic microvascular ischemic changes. 3. Soft tissue swelling over the right orbit and cheek, possible contusion given history of trauma. 4. Multilevel degenerative changes in the cervical spine without evidence of acute fracture. 5. 1.7 cm  nodule in the left lobe of the thyroid gland. Ultrasound is recommended for further characterization on non emergent follow-up. Electronically Signed   By: Brett Fairy M.D.   On: 11/11/2021 23:30   CT Chest Wo Contrast  Result Date: 11/11/2021 CLINICAL DATA:  Recent fall while walking dog with chest pain, initial encounter EXAM: CT CHEST WITHOUT CONTRAST TECHNIQUE: Multidetector CT imaging of the chest was performed following the standard protocol without IV contrast. RADIATION DOSE REDUCTION: This exam was performed according to the departmental dose-optimization program which includes automated exposure control, adjustment of the mA and/or kV according to patient size and/or use of iterative reconstruction technique. COMPARISON:  None Available. FINDINGS: Cardiovascular: Somewhat limited due to lack of IV contrast. Aortic calcifications are noted without aneurysmal dilatation. Heart is not significantly enlarged. Mitral annular calcifications are noted. No pericardial effusion is seen. Mediastinum/Nodes: Thoracic inlet is within normal limits. No hilar or mediastinal adenopathy is noted. The esophagus as visualized shows a small sliding-type hiatal hernia. Calcified left hilar adenopathy is noted consistent with prior granulomatous disease. Lungs/Pleura: Lungs are well aerated bilaterally. No focal infiltrate or sizable effusion is seen. Upper Abdomen: Visualized upper abdomen shows no acute abnormality. Musculoskeletal: No acute rib abnormality is noted. Degenerative changes of the thoracic spine are noted. Comminuted fracture of the proximal right humerus is identified. Considerable soft tissue swelling is noted as well as some hemorrhage within the marrow cavity of the proximal humerus. No dislocation is noted. IMPRESSION: Comminuted fracture of the proximal right humerus with some hemorrhage noted within the marrow cavity. There are acute abnormality is noted. Aortic Atherosclerosis (ICD10-I70.0).  Electronically Signed   By: Inez Catalina M.D.   On: 11/11/2021 23:27   CT NO CHARGE  Result Date: 11/11/2021 CLINICAL DATA:  Shoulder trauma with fracture of humerus or scapula. Acute pain of the right shoulder due to trauma. Patient was walking the dog and patient was pulled to the ground. Landed on right shoulder and heard a pop. Old deformity from childhood. EXAM: CT ADDITIONAL VIEWS AT NO CHARGE TECHNIQUE: CT images of the right shoulder reconstructed from contemporaneous CT chest. CONTRAST:  No additional contrast material was administered. COMPARISON:  Right shoulder radiographs 11/11/2021 FINDINGS: Comminuted fractures of the right humeral neck extending into the right humeral head with mild impaction of the fracture fragments and mild displacement of greater tuberosity fragment. Degenerative changes in the glenohumeral and acromioclavicular joints. Mild soft tissue swelling with infiltration in the subcutaneous fat anterior to the right shoulder consistent with contusion. IMPRESSION: Comminuted mildly displaced and impacted fractures of the right humeral head and neck. No articular involvement. Degenerative changes. Electronically Signed   By: Lucienne Capers M.D.   On: 11/11/2021 23:27   DG Shoulder 1 View Right  Result Date: 11/11/2021 CLINICAL DATA:  Fall EXAM: RIGHT SHOULDER - 1 VIEW COMPARISON:  None Available. FINDINGS: Fracture of the greater tuberosity. Possible fracture of the humeral neck difficult to confirm on single view. Clavicle intact.  No rib fracture. IMPRESSION: Fracture of the greater tuberosity. Cannot exclude right humeral neck fracture. Recommend  CT for further evaluation Electronically Signed   By: Franchot Gallo M.D.   On: 11/11/2021 19:00    Procedures Procedures  {Document cardiac monitor, telemetry assessment procedure when appropriate:1}  Medications Ordered in ED Medications  acetaminophen (TYLENOL) tablet 1,000 mg (has no administration in time range)   fentaNYL (SUBLIMAZE) injection 25 mcg (has no administration in time range)  fentaNYL (SUBLIMAZE) injection 25 mcg (25 mcg Intravenous Given 11/11/21 2113)    ED Course/ Medical Decision Making/ A&P                           Medical Decision Making Amount and/or Complexity of Data Reviewed Radiology: ordered.  Risk OTC drugs. Prescription drug management.   ***  {Document critical care time when appropriate:1} {Document review of labs and clinical decision tools ie heart score, Chads2Vasc2 etc:1}  {Document your independent review of radiology images, and any outside records:1} {Document your discussion with family members, caretakers, and with consultants:1} {Document social determinants of health affecting pt's care:1} {Document your decision making why or why not admission, treatments were needed:1} Final Clinical Impression(s) / ED Diagnoses Final diagnoses:  None    Rx / DC Orders ED Discharge Orders     None

## 2021-11-11 NOTE — ED Triage Notes (Signed)
Patient was walking dog and dog took off after deer and pulled patient to the ground landing on right shoulder and hearing a pop.  Deformity on arm is old from childhood.  Patient has small hematoma below right eye.  No LOC.  No blood thinners.  <3 sec cap refill and +right radial pulse present.

## 2021-11-12 ENCOUNTER — Emergency Department (HOSPITAL_COMMUNITY): Payer: Medicare HMO

## 2021-11-12 MED ORDER — OXYCODONE HCL 5 MG PO TABS: 5.0000 mg | ORAL_TABLET | ORAL | 0 refills | Status: AC | PRN

## 2021-11-12 NOTE — Discharge Instructions (Addendum)
You have been diagnosed with a broken arm near your shoulder (proximal humerus fracture) for which you will wear a sling.  Your elbow XR shows a possible fracture of your coronoid process of your elbow however is not clearly a source of pain on your exam and feel maintaining the sling and discussing this with Ortho is appropriate.  Similarly, there is an abnormality at your wrist (distal radius) but this is not clearly a source of pain and not clearly a fracture on XR and will place you in a removable splint and recommend reevaluation with Orthopedics this week.  It was a pleasure caring for you today!    For your pain, you may take up to '1000mg'$  of acetaminophen (tylenol) 4 times daily for up to a week. This is the maximum dose of acetminophen (tylenol) you can take from all sources. Please check other over-the-counter medications and prescriptions to ensure you are not taking other medications that contain acetaminophen.  You may also take ibuprofen 400 mg 6 times a day OR '600mg'$  4 times a day alternating with or at the same time as tylenol.  Take oxycodone as needed for breakthrough pain.  This medication can be addicting, sedating and cause constipation.
